# Patient Record
Sex: Female | Born: 1995
Health system: Southern US, Community
[De-identification: ages and names within clinical notes are randomized; demographics above are authoritative.]

## PROBLEM LIST (undated history)

## (undated) DIAGNOSIS — Z1501 Genetic susceptibility to malignant neoplasm of breast: Secondary | ICD-10-CM

## (undated) DIAGNOSIS — Z8041 Family history of malignant neoplasm of ovary: Secondary | ICD-10-CM

## (undated) DIAGNOSIS — Z8481 Family history of carrier of genetic disease: Secondary | ICD-10-CM

## (undated) DIAGNOSIS — Z1509 Genetic susceptibility to other malignant neoplasm: Secondary | ICD-10-CM

## (undated) DIAGNOSIS — G43909 Migraine, unspecified, not intractable, without status migrainosus: Secondary | ICD-10-CM

## (undated) DIAGNOSIS — Z803 Family history of malignant neoplasm of breast: Secondary | ICD-10-CM

## (undated) DIAGNOSIS — J45909 Unspecified asthma, uncomplicated: Secondary | ICD-10-CM

## (undated) HISTORY — DX: Family history of carrier of genetic disease: Z84.81

## (undated) HISTORY — DX: Migraine, unspecified, not intractable, without status migrainosus: G43.909

## (undated) HISTORY — DX: Genetic susceptibility to other malignant neoplasm: Z15.09

## (undated) HISTORY — DX: Family history of malignant neoplasm of breast: Z80.3

## (undated) HISTORY — DX: Family history of malignant neoplasm of ovary: Z80.41

## (undated) HISTORY — DX: Genetic susceptibility to malignant neoplasm of breast: Z15.01

## (undated) HISTORY — DX: Unspecified asthma, uncomplicated: J45.909

## (undated) HISTORY — PX: TONSILLECTOMY: SHX5217

## (undated) HISTORY — PX: ADENOIDECTOMY: SHX5191

---
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
## 2016-01-13 DIAGNOSIS — R87612 Low grade squamous intraepithelial lesion on cytologic smear of cervix (LGSIL): Secondary | ICD-10-CM | POA: Insufficient documentation

## 2016-01-13 DIAGNOSIS — Z8481 Family history of carrier of genetic disease: Secondary | ICD-10-CM | POA: Insufficient documentation

## 2016-01-13 DIAGNOSIS — G43009 Migraine without aura, not intractable, without status migrainosus: Secondary | ICD-10-CM | POA: Insufficient documentation

## 2017-07-20 DIAGNOSIS — Z1509 Genetic susceptibility to other malignant neoplasm: Secondary | ICD-10-CM

## 2017-07-20 DIAGNOSIS — Z1501 Genetic susceptibility to malignant neoplasm of breast: Secondary | ICD-10-CM

## 2017-07-20 HISTORY — DX: Genetic susceptibility to other malignant neoplasm: Z15.09

## 2017-07-20 HISTORY — DX: Genetic susceptibility to malignant neoplasm of breast: Z15.01

## 2018-04-01 ENCOUNTER — Other Ambulatory Visit: Payer: Self-pay

## 2018-04-01 ENCOUNTER — Other Ambulatory Visit (HOSPITAL_COMMUNITY)
Admission: RE | Admit: 2018-04-01 | Discharge: 2018-04-01 | Disposition: A | Discharge status: Home / Self Care | Payer: 59 | Source: Ambulatory Visit | Attending: Obstetrics and Gynecology | Admitting: Obstetrics and Gynecology

## 2018-04-01 ENCOUNTER — Encounter: Payer: Self-pay | Admitting: Obstetrics and Gynecology

## 2018-04-01 ENCOUNTER — Ambulatory Visit: Payer: 59 | Admitting: Obstetrics and Gynecology

## 2018-04-01 VITALS — BP 100/62 | Ht 61.0 in | Wt 128.0 lb

## 2018-04-01 DIAGNOSIS — Z8481 Family history of carrier of genetic disease: Secondary | ICD-10-CM | POA: Diagnosis not present

## 2018-04-01 DIAGNOSIS — Z01419 Encounter for gynecological examination (general) (routine) without abnormal findings: Secondary | ICD-10-CM | POA: Diagnosis not present

## 2018-04-01 DIAGNOSIS — Z789 Other specified health status: Secondary | ICD-10-CM

## 2018-04-01 DIAGNOSIS — IMO0001 Reserved for inherently not codable concepts without codable children: Secondary | ICD-10-CM

## 2018-04-01 DIAGNOSIS — Z3042 Encounter for surveillance of injectable contraceptive: Secondary | ICD-10-CM

## 2018-04-01 MED ORDER — MEDROXYPROGESTERONE ACETATE 150 MG/ML IM SUSP
150.0000 mg | Freq: Once | INTRAMUSCULAR | Status: AC
  Administered 2018-04-01: 150 mg via INTRAMUSCULAR

## 2018-04-01 NOTE — Progress Notes (Signed)
22 y.o. G0P0000 Single Caucasian female here for annual exam.    Hx migraines without aura. Feels groogy the day before her HA. Has seen neurology.   On Depo provera.  Does not have menses.  Last Depo was 01/11/18.  Never used COCs.  Not sexually active for the past year.  Has had prior STD screening.   Mother dx with ovarian cancer at age 68.  Mother tested positive for BRCA1.  Maternal grandmother died at 107 from breast cancer.   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   PCP:   none  Patient's last menstrual period was 05/21/2015 (approximate).     Period Cycle (Days): (Depo injection, no cycle)     Sexually active: Yes.    The current method of family planning is Depo-Provera injections.    Exercising: No.  The patient does not participate in regular exercise at present. Smoker:  no  Health Maintenance: Pap:  07/2017 - abnormal per patient.  Thinks it was LGSIL. History of abnormal Pap:  All have been abnormal, colposcopy no bx.   MMG:  n/a Colonoscopy:  n/a BMD:   n/a  Result  n/a TD 2017 Gardasil:   Yes, 06/23/2006, 02/09/2006, 11/23/2005 JOA:CZYSA  Hep C: never Screening Labs:  Hb today: n/a, Urine today: n/a  Pt arrived for Depo Provera injection.  Pt tolerated injection well in right gluteal.  Pt should return between [facility] and Dec 14    reports that she has never smoked. She has never used smokeless tobacco. She reports that she has current or past drug history. She reports that she does not drink alcohol.  Past Medical History:  Diagnosis Date  . Migraine    no apparent aura    Past Surgical History:  Procedure Laterality Date  . KNEE SURGERY Right 2012    Current Outpatient Medications  Medication Sig Dispense Refill  . Aspirin-Acetaminophen-Caffeine (MIGRAINE RELIEF PO) Take by mouth as needed.    . medroxyPROGESTERone (DEPO-PROVERA) 150 MG/ML injection as directed.  3   No current facility-administered medications for this  visit.     Family History  Problem Relation Age of Onset  . Ovarian cancer Mother        BRCA positive  . Breast cancer Maternal Grandmother     Review of Systems  Constitutional: Negative.   HENT: Negative.   Eyes: Negative.   Respiratory: Negative.   Cardiovascular: Negative.   Gastrointestinal: Negative.   Endocrine: Negative.   Genitourinary: Negative.   Musculoskeletal: Negative.   Skin: Negative.   Allergic/Immunologic: Negative.   Neurological: Negative.   Hematological: Negative.   Psychiatric/Behavioral: Negative.   All other systems reviewed and are negative.   Exam:   BP 100/62   Ht _0  (1.549 m)   Wt 128 lb (58.1 kg)   LMP 05/21/2015 (Approximate)   BMI 24.19 kg/m     General appearance: alert, cooperative and appears stated age Head: Normocephalic, without obvious abnormality, atraumatic Neck: no adenopathy, supple, symmetrical, trachea midline and thyroid normal to inspection and palpation Lungs: clear to auscultation bilaterally Breasts: normal appearance, no masses or tenderness, No nipple retraction or dimpling, No nipple discharge or bleeding, No axillary or supraclavicular adenopathy Heart: regular rate and rhythm Abdomen: soft, non-tender; no masses, no organomegaly Extremities: extremities normal, atraumatic, no cyanosis or edema Skin: Skin color, texture, turgor normal. No rashes or lesions Lymph nodes: Cervical, supraclavicular, and axillary nodes normal. No abnormal inguinal nodes palpated Neurologic: Grossly  normal  Pelvic: External genitalia:  no lesions              Urethra:  normal appearing urethra with no masses, tenderness or lesions              Bartholins and Skenes: normal                 Vagina: normal appearing vagina with normal color and discharge, no lesions              Cervix: no lesions              Pap taken: Yes.   Bimanual Exam:  Uterus:  normal size, contour, position, consistency, mobility, non-tender               Adnexa: no mass, fullness, tenderness             Chaperone was present for exam.  Assessment:   Well woman visit with normal exam. Migraines.  No aura.  FH ovarian cancer.  Mother is BRCA1 positive.  Depo Provera patient.  Hx abnormal paps.   Plan: Mammogram screening to be defined based on genetic testing.  Recommended self breast awareness. Pap and HR HPV as above. Guidelines for Calcium, Vitamin D, regular exercise program including cardiovascular and weight bearing exercise. Routine labs. Referral for genetic counseling and testing.  Continue depo Provera 150 mg IM q 3 months for one year.  OK to give today.  Get pap and colpo records. Follow up annually and prn.   After visit summary provided.

## 2018-04-01 NOTE — Patient Instructions (Signed)

## 2018-04-02 LAB — COMPREHENSIVE METABOLIC PANEL
A/G RATIO: 2.4 — AB (ref 1.2–2.2)
ALK PHOS: 41 IU/L (ref 39–117)
ALT: 12 IU/L (ref 0–32)
AST: 19 IU/L (ref 0–40)
Albumin: 5 g/dL (ref 3.5–5.5)
BUN / CREAT RATIO: 15 (ref 9–23)
BUN: 12 mg/dL (ref 6–20)
Bilirubin Total: 0.4 mg/dL (ref 0.0–1.2)
CHLORIDE: 102 mmol/L (ref 96–106)
CO2: 26 mmol/L (ref 20–29)
Calcium: 10.1 mg/dL (ref 8.7–10.2)
Creatinine, Ser: 0.81 mg/dL (ref 0.57–1.00)
GFR calc Af Amer: 118 mL/min/{1.73_m2} (ref >59)
GFR calc non Af Amer: 103 mL/min/{1.73_m2} (ref >59)
GLOBULIN, TOTAL: 2.1 g/dL (ref 1.5–4.5)
Glucose: 77 mg/dL (ref 65–99)
POTASSIUM: 4.6 mmol/L (ref 3.5–5.2)
Sodium: 140 mmol/L (ref 134–144)
Total Protein: 7.1 g/dL (ref 6.0–8.5)

## 2018-04-02 LAB — CBC
HEMATOCRIT: 38.7 % (ref 34.0–46.6)
Hemoglobin: 13.1 g/dL (ref 11.1–15.9)
MCH: 30.2 pg (ref 26.6–33.0)
MCHC: 33.9 g/dL (ref 31.5–35.7)
MCV: 89 fL (ref 79–97)
Platelets: 299 10*3/uL (ref 150–450)
RBC: 4.34 x10E6/uL (ref 3.77–5.28)
RDW: 12.4 % (ref 12.3–15.4)
WBC: 7.3 10*3/uL (ref 3.4–10.8)

## 2018-04-02 LAB — LIPID PANEL
CHOLESTEROL TOTAL: 173 mg/dL (ref 100–199)
Chol/HDL Ratio: 2.5 ratio (ref 0.0–4.4)
HDL: 68 mg/dL (ref >39)
LDL CALC: 93 mg/dL (ref 0–99)
Triglycerides: 62 mg/dL (ref 0–149)
VLDL Cholesterol Cal: 12 mg/dL (ref 5–40)

## 2018-04-05 LAB — CYTOLOGY - PAP
DIAGNOSIS: NEGATIVE
HPV: DETECTED — AB

## 2018-04-08 ENCOUNTER — Encounter: Payer: Self-pay | Admitting: Genetic Counselor

## 2018-04-08 ENCOUNTER — Telehealth: Payer: Self-pay | Admitting: Genetic Counselor

## 2018-04-08 NOTE — Telephone Encounter (Signed)
New genetic counseling referral received from Dr. Quincy Simmonds. Pt has been scheduled to see Roma Kayser on 10/14 at 2pm. Letter mailed to the pt.

## 2018-04-12 ENCOUNTER — Other Ambulatory Visit: Payer: Self-pay | Admitting: *Deleted

## 2018-04-12 ENCOUNTER — Telehealth: Payer: Self-pay | Admitting: Genetic Counselor

## 2018-04-12 DIAGNOSIS — R87810 Cervical high risk human papillomavirus (HPV) DNA test positive: Secondary | ICD-10-CM

## 2018-04-12 NOTE — Telephone Encounter (Signed)
Pt cld to reschedule appt with Roma Kayser to 10/23 at 10am.

## 2018-05-02 ENCOUNTER — Encounter: Payer: 59 | Admitting: Genetic Counselor

## 2018-05-02 ENCOUNTER — Other Ambulatory Visit: Payer: 59

## 2018-05-04 ENCOUNTER — Ambulatory Visit (INDEPENDENT_AMBULATORY_CARE_PROVIDER_SITE_OTHER): Payer: 59 | Admitting: Obstetrics and Gynecology

## 2018-05-04 ENCOUNTER — Encounter: Payer: Self-pay | Admitting: Obstetrics and Gynecology

## 2018-05-04 ENCOUNTER — Other Ambulatory Visit: Payer: Self-pay

## 2018-05-04 ENCOUNTER — Ambulatory Visit: Payer: Self-pay

## 2018-05-04 VITALS — BP 110/60 | HR 84 | Ht 61.0 in | Wt 130.0 lb

## 2018-05-04 DIAGNOSIS — N72 Inflammatory disease of cervix uteri: Secondary | ICD-10-CM | POA: Diagnosis not present

## 2018-05-04 DIAGNOSIS — Z01812 Encounter for preprocedural laboratory examination: Secondary | ICD-10-CM

## 2018-05-04 DIAGNOSIS — R87810 Cervical high risk human papillomavirus (HPV) DNA test positive: Secondary | ICD-10-CM

## 2018-05-04 LAB — POCT URINE PREGNANCY: PREG TEST UR: NEGATIVE

## 2018-05-04 NOTE — Progress Notes (Signed)
  Subjective:     Patient ID: Rachel Stein, female   DOB: 1996-05-21, 22 y.o.   MRN: [id number]  HPI Patient here today for colposcopy for abnormal pap smear 04-01-18 Neg:Pos HR HPV. Personal hx of positive HR HPV in January 2019.   Review of Systems  All other systems reviewed and are negative.  LMP: 05-21-15 Contraception: Depo Provera UPT: Neg    Objective:   Physical Exam  Genitourinary:    Colposcopy - cervix, vagina, and vulva.  Consent for procedure.  3% acetic acid used in vagina and on vulva. White light and green light filter used.  Colposcopy satisfactory:  Yes   ___x__          No    _____ Findings:    Cervix:  punctations at 12:00, thickened acetowhite change at 6:00. Vagina: no lesions noted. Biopsies:   ECC, biopsy at 6:00, and biopsy at 12:00. Silver nitrate and Monsel's placed to biopsy sites.  EBL 5 cc.  No complications.       Assessment:     Positive HR HPV.     Plan:     We discussed HPV, paps, colposcopy, and LEEP briefly.  FU biopsies.  Precautions given.   After visit summary to patient.

## 2018-05-04 NOTE — Patient Instructions (Signed)
Colposcopy, Care After  This sheet gives you information about how to care for yourself after your procedure. Your doctor may also give you more specific instructions. If you have problems or questions, contact your doctor.  What can I expect after the procedure?  If you did not have a tissue sample removed (did not have a biopsy), you may only have some spotting for a few days. You can go back to your normal activities.  If you had a tissue sample removed, it is common to have:  · Soreness and pain. This may last for a few days.  · Light-headedness.  · Mild bleeding from your vagina or dark-colored, grainy discharge from your vagina. This may last for a few days. You may need to wear a sanitary pad.  · Spotting for at least 48 hours after the procedure.    Follow these instructions at home:  · Take over-the-counter and prescription medicines only as told by your doctor. Ask your doctor what medicines you can start taking again. This is very important if you take blood-thinning medicine.  · Do not drive or use heavy machinery while taking prescription pain medicine.  · For 3 days, or as long as your doctor tells you, avoid:  ? Douching.  ? Using tampons.  ? Having sex.  · If you use birth control (contraception), keep using it.  · Limit activity for the first day after the procedure. Ask your doctor what activities are safe for you.  · It is up to you to get the results of your procedure. Ask your doctor when your results will be ready.  · Keep all follow-up visits as told by your doctor. This is important.  Contact a doctor if:  · You get a skin rash.  Get help right away if:  · You are bleeding a lot from your vagina. It is a lot of bleeding if you are using more than one pad an hour for 2 hours in a row.  · You have clumps of blood (blood clots) coming from your vagina.  · You have a fever.  · You have chills  · You have pain in your lower belly (pelvic area).  · You have signs of infection, such as vaginal  discharge that is:  ? Different than usual.  ? Yellow.  ? Bad-smelling.  · You have very pain or cramps in your lower belly that do not get better with medicine.  · You feel light-headed.  · You feel dizzy.  · You pass out (faint).  Summary  · If you did not have a tissue sample removed (did not have a biopsy), you may only have some spotting for a few days. You can go back to your normal activities.  · If you had a tissue sample removed, it is common to have mild pain and spotting for 48 hours.  · For 3 days, or as long as your doctor tells you, avoid douching, using tampons and having sex.  · Get help right away if you have bleeding, very bad pain, or signs of infection.  This information is not intended to replace advice given to you by your health care provider. Make sure you discuss any questions you have with your health care provider.  Document Released: 12/23/2007 Document Revised: 03/25/2016 Document Reviewed: 03/25/2016  Elsevier Interactive Patient Education © 2018 Elsevier Inc.

## 2018-05-06 ENCOUNTER — Telehealth: Payer: Self-pay | Admitting: Obstetrics and Gynecology

## 2018-05-06 NOTE — Telephone Encounter (Signed)
Called patient again. Voicemail confirms first and last name. Did not leave another message. Trying to reach patient before office closes.

## 2018-05-06 NOTE — Telephone Encounter (Signed)
Patient called and said she is concerned because she is bleeding more today since having her colposcopy on 05/04/18 than she was.

## 2018-05-06 NOTE — Telephone Encounter (Signed)
Called pt, no answer. Left message to return my call.   Colposcopy on 05/04/18.  ECC and cervical  biopsy x 2. Silver nitrate and monsels used.  Depo provera for birth control.

## 2018-05-07 ENCOUNTER — Encounter: Payer: Self-pay | Admitting: Obstetrics and Gynecology

## 2018-05-09 NOTE — Telephone Encounter (Signed)
-----   Message from Nunzio Cobbs, MD sent at 05/07/2018  7:49 AM EDT ----- Please report results of colposcopy to patient.  Her biopsies are all benign.  She will need to return to have her next pap in one year.  Pap recall - 08.  Positive HR HPV.  Prior outside lab - positive HR HPV also.

## 2018-05-09 NOTE — Telephone Encounter (Signed)
Patient returned call. She states bleeding is much better.  Using 1 pad per day. Spotting only, red blood. No pain, no fevers, no vaginal discharge. Advised patient to call back if any concerning symptoms, discussed bleeding, pain and discharge s/p colposcopy.   Results given to patient.  She verbalized understanding of results.  Stressed importance of repeat pap smear in one year.  Patient agreeable to plan.  Will follow up prn.  Encounter to Dr. Quincy Simmonds and will close.  08 recall entered.

## 2018-05-11 ENCOUNTER — Inpatient Hospital Stay: Payer: 59 | Attending: Genetic Counselor | Admitting: Genetic Counselor

## 2018-05-11 ENCOUNTER — Inpatient Hospital Stay: Payer: 59

## 2018-05-11 ENCOUNTER — Encounter: Payer: Self-pay | Admitting: Genetic Counselor

## 2018-05-11 DIAGNOSIS — Z8041 Family history of malignant neoplasm of ovary: Secondary | ICD-10-CM

## 2018-05-11 DIAGNOSIS — Z803 Family history of malignant neoplasm of breast: Secondary | ICD-10-CM

## 2018-05-11 DIAGNOSIS — Z7183 Encounter for nonprocreative genetic counseling: Secondary | ICD-10-CM

## 2018-05-11 DIAGNOSIS — Z8481 Family history of carrier of genetic disease: Secondary | ICD-10-CM

## 2018-05-11 NOTE — Progress Notes (Signed)
REFERRING PROVIDER: Nunzio Cobbs, MD [address] [place], [place] [address]  PRIMARY PROVIDER:  Patient, No Pcp Per  PRIMARY REASON FOR VISIT:  1. Family history of BRCA1 gene positive   2. Family history of ovarian cancer   3. Family history of breast cancer      HISTORY OF PRESENT ILLNESS:   Rachel Stein, a 22 y.o. female, was seen for a [facility] cancer genetics consultation at the request of Dr. Yisroel Ramming due to a family history of cancer.  Rachel Stein presents to clinic today to discuss the possibility of a hereditary predisposition to cancer, genetic testing, and to further clarify her future cancer risks, as well as potential cancer risks for family members.   Rachel Stein is a 22 y.o. female with no personal history of cancer.  Her mother and two sisters have tested positive for a BRCA1 del exon 1-2.  CANCER HISTORY:   No history exists.     HORMONAL RISK FACTORS:  Menarche was at age 24.  First live birth at age N/A.  OCP use for approximately depoprovera for 3 years years.  Ovaries intact: yes.  Hysterectomy: no.  Menopausal status: premenopausal.  HRT use: 0 years. Colonoscopy: no; not examined. Mammogram within the last year: no. Number of breast biopsies: 0. Up to date with pelvic exams:  yes. Any excessive radiation exposure in the past:  no  Past Medical History:  Diagnosis Date  . Family history of BRCA gene mutation    patient's mother - BRCA1 positive  . Family history of breast cancer   . Family history of ovarian cancer   . Migraine    no apparent aura    Past Surgical History:  Procedure Laterality Date  . KNEE SURGERY Right 2012    Social History   Socioeconomic History  . Marital status: Single    Spouse name: Not on file  . Number of children: Not on file  . Years of education: Not on file  . Highest education level: Not on file  Occupational History  . Not on file  Social Needs  . Financial  resource strain: Not on file  . Food insecurity:    Worry: Not on file    Inability: Not on file  . Transportation needs:    Medical: Not on file    Non-medical: Not on file  Tobacco Use  . Smoking status: Never Smoker  . Smokeless tobacco: Never Used  Substance and Sexual Activity  . Alcohol use: Never    Frequency: Never  . Drug use: Not Currently  . Sexual activity: Yes    Birth control/protection: Injection  Lifestyle  . Physical activity:    Days per week: Not on file    Minutes per session: Not on file  . Stress: Not on file  Relationships  . Social connections:    Talks on phone: Not on file    Gets together: Not on file    Attends religious service: Not on file    Active member of club or organization: Not on file    Attends meetings of clubs or organizations: Not on file    Relationship status: Not on file  Other Topics Concern  . Not on file  Social History Narrative  . Not on file     FAMILY HISTORY:  We obtained a detailed, 4-generation family history.  Significant diagnoses are listed below: Family History  Problem Relation Age of Onset  .  Ovarian cancer Mother 48       BRCA positive  . BRCA 1/2 Mother        BRCA1  . Breast cancer Maternal Grandmother        d. 3  . BRCA 1/2 Sister        BRCA1 pos  . BRCA 1/2 Sister        BRCA1 pos  . Uterine cancer Other        MGMs sister    The patient has two sisters who are cancer free, but are BRCA1 pos.  Both parents are living.  The patient's mother developed ovarian cancer at 1 and is BRCA1 pos.  She has a maternal half brother who not much is known about.  Both maternal grandparents are deceased.  The grandmother developed breast cancer at 88 and died at 61. She has a sister who had uterine cancer.  The patient's father is alive and cancer free.  He had two brothers and a sister and a maternal 1/2 sister.  All are cancer free.  There is no reported cancer history on this side of the family.  Ms.  Stein is aware of previous family history of genetic testing for hereditary cancer risks. Patient's maternal ancestors are of Caucasian descent, and paternal ancestors are of [place] descent. There is no reported Ashkenazi Jewish ancestry. There is no known consanguinity.  GENETIC COUNSELING ASSESSMENT: Rachel Stein is a 22 y.o. female with a family history of breast and ovarian cancer and a known BRCA1 mutation which is somewhat suggestive of a hereditary cancer syndrome and predisposition to cancer. We, therefore, discussed and recommended the following at today's visit.   DISCUSSION: We discussed that about 5-10% of breast cancer and up to 20% of ovarian cancer are due to hereditary causes, most commonly BRCA mutations.  The patient has a 50% chance of testing positive for the known mutation in her mother.  We discussed that about 6% of individuals who have one mutation, have a second hereditary mutation as well.    We reviewed the characteristics, features and inheritance patterns of hereditary cancer syndromes. We also discussed genetic testing, including the appropriate family members to test, the process of testing, insurance coverage and turn-around-time for results. We discussed the implications of a negative, positive and/or variant of uncertain significant result. In order to get genetic test results in a timely manner so that Rachel Stein can use these genetic test results for surgical decisions, we recommended Rachel Stein pursue genetic testing for the 9-gene STAT panel. If this test is negative, we then recommend Rachel Stein pursue reflex genetic testing to the Multi cancer gene panel. The Hereditary Gene Panel offered by [facility] includes sequencing and/or deletion duplication testing of the following 47 genes: APC, ATM, AXIN2, BARD1, BMPR1A, BRCA1, BRCA2, BRIP1, CDH1, CDK4, CDKN2A (p14ARF), CDKN2A (p16INK4a), CHEK2, CTNNA1, DICER1, EPCAM (Deletion/duplication testing only), GREM1  (promoter region deletion/duplication testing only), KIT, MEN1, MLH1, MSH2, MSH3, MSH6, MUTYH, NBN, NF1, NHTL1, PALB2, PDGFRA, PMS2, POLD1, POLE, PTEN, RAD50, RAD51C, RAD51D, SDHB, SDHC, SDHD, SMAD4, SMARCA4. STK11, TP53, TSC1, TSC2, and VHL.  The following genes were evaluated for sequence changes only: SDHA and HOXB13 c.251G>A variant only.   Based on Rachel Stein's family history of cancer, she meets medical criteria for genetic testing. Despite that she meets criteria, she may still have an out of pocket cost. We discussed that if her out of pocket cost for testing is over $100, the laboratory will call and confirm whether  she wants to proceed with testing.  If the out of pocket cost of testing is less than $100 she will be billed by the genetic testing laboratory.   PLAN: After considering the risks, benefits, and limitations, Rachel Stein  provided informed consent to pursue genetic testing and the blood sample was sent to [facility] for analysis of the STAT panel, reflexing to the Multi cancer gene panel. Results should be available within approximately 5-12 days, and the reflex can take an additional 1-2 weeks' time, at which point they will be disclosed by telephone to Rachel Stein, as will any additional recommendations warranted by these results. Rachel Stein will receive a summary of her genetic counseling visit and a copy of her results once available. This information will also be available in Epic. We encouraged Rachel Stein to remain in contact with cancer genetics annually so that we can continuously update the family history and inform her of any changes in cancer genetics and testing that may be of benefit for her family. Rachel Stein questions were answered to her satisfaction today. Our contact information was provided should additional questions or concerns arise.  Lastly, we encouraged Rachel Stein to remain in contact with cancer genetics annually so that we can continuously  update the family history and inform her of any changes in cancer genetics and testing that may be of benefit for this family.   Ms.  Stein questions were answered to her satisfaction today. Our contact information was provided should additional questions or concerns arise. Thank you for the referral and allowing [place] to share in the care of your patient.   Rachel Sliva P. [place]ne [place], [place], [facility] Certified Genetic Counselor [email or web address] phone: [phone]  The patient was seen for a total of 35 minutes in face-to-face genetic counseling.  This patient was discussed with Drs. Magrinat, Lindi Adie and/or Burr Medico who agrees with the above.    _______________________________________________________________________ For Office Staff:  Number of people involved in session: 1 Was an Intern/ student involved with case: no

## 2018-05-20 ENCOUNTER — Telehealth: Payer: Self-pay | Admitting: Genetic Counselor

## 2018-05-20 ENCOUNTER — Encounter: Payer: Self-pay | Admitting: Genetic Counselor

## 2018-05-20 DIAGNOSIS — Z1509 Genetic susceptibility to other malignant neoplasm: Secondary | ICD-10-CM

## 2018-05-20 DIAGNOSIS — Z1379 Encounter for other screening for genetic and chromosomal anomalies: Secondary | ICD-10-CM | POA: Insufficient documentation

## 2018-05-20 DIAGNOSIS — Z1501 Genetic susceptibility to malignant neoplasm of breast: Secondary | ICD-10-CM | POA: Insufficient documentation

## 2018-05-20 NOTE — Telephone Encounter (Signed)
Revealed that patinet tested positive for the [facility] in BRCA1.  She will come to clinic on 11/14 at 11 AM.

## 2018-05-31 ENCOUNTER — Inpatient Hospital Stay: Payer: 59 | Attending: Genetic Counselor | Admitting: Genetic Counselor

## 2018-05-31 DIAGNOSIS — Z1501 Genetic susceptibility to malignant neoplasm of breast: Secondary | ICD-10-CM | POA: Diagnosis not present

## 2018-05-31 DIAGNOSIS — Z1379 Encounter for other screening for genetic and chromosomal anomalies: Secondary | ICD-10-CM

## 2018-05-31 DIAGNOSIS — Z1509 Genetic susceptibility to other malignant neoplasm: Secondary | ICD-10-CM | POA: Diagnosis not present

## 2018-05-31 NOTE — Progress Notes (Signed)
GENETIC TEST RESULTS   Patient Name: Rachel Stein Patient Age: 22 y.o. Encounter Date: 05/31/2018  Referring Provider: Josefa Half, MD    Rachel Stein was seen in the [place] clinic on May 31, 2018 due to a family history of cancer and an identified BRCA1 pathogenic mutation. Please refer to the prior Genetics clinic note for more information regarding Rachel Stein's medical and family histories and our assessment at the time.   FAMILY HISTORY:  We obtained a detailed, 4-generation family history.  Significant diagnoses are listed below: Family History  Problem Relation Age of Onset  . Ovarian cancer Mother 79       BRCA positive  . BRCA 1/2 Mother        BRCA1  . Breast cancer Maternal Grandmother        d. 50  . BRCA 1/2 Sister        BRCA1 pos  . BRCA 1/2 Sister        BRCA1 pos  . Uterine cancer Other        MGMs sister    The patient has two sisters who are cancer free, but are BRCA1 pos.  Both parents are living.  The patient's mother developed ovarian cancer at 88 and is BRCA1 pos.  She has a maternal half brother who not much is known about.  Both maternal grandparents are deceased.  The grandmother developed breast cancer at 31 and died at 77. She has a sister who had uterine cancer.  The patient's father is alive and cancer free.  He had two brothers and a sister and a maternal 1/2 sister.  All are cancer free.  There is no reported cancer history on this side of the family.  Rachel Stein is aware of previous family history of genetic testing for hereditary cancer risks. Patient's maternal ancestors are of Caucasian descent, and paternal ancestors are of [place] descent. There is no reported Ashkenazi Jewish ancestry. There is no known consanguinity.  GENETIC TESTING:  At the time of Rachel Stein's visit, we recommended she pursue genetic testing of the common hereditary cancer panel. The genetic testing reported on May 20, 2018 through the  Common Hereditary Cancer Panel offered by [facility] identified a single, heterozygous pathogenic gene mutation called BRCA1, Deletion (Exons 1-2). There were no deleterious mutations in APC, ATM, AXIN2, BARD1, BMPR1A, BRCA2, BRIP1, CDH1, CDK4, CDKN2A, CHEK2, CTNNA1, DICER1, EPCAM, GREM1, HOXB13, KIT, MEN1, MLH1, MSH2, MSH3, MSH6, MUTYH, NBN, NF1, NTHL1, PALB2, PDGFRA, PMS2, POLD1, POLE, PTEN, RAD50, RAD51C, RAD51D, SDHA, SDHB, SDHC, SDHD, SMAD4, SMARCA4. STK11, TP53, TSC1, TSC2, and VHL .  MEDICAL MANAGEMENT: Women who have a BRCA mutation have an increased risk for both breast and ovarian cancer.   As discussed with Rachel Stein, to reduce the risk for breast cancer, prophylactic bilateral mastectomy is the most effective option for risk reduction. However, for women who choose to keep their breasts intensified screening is equally safe.  We recommend yearly mammograms, yearly breast MRI, twice-yearly clinical breast exams, and monthly self-breast exams. Rachel Stein will be referred to the [facility] high risk clinic to discuss surgical options.  To reduce the risk for ovarian cancer, we recommend Rachel Stein have a prophylactic bilateral salpingo-oophorectomy when childbearing is completed, if planned. We discussed that screening with CA-125 blood tests and transvaginal ultrasounds can be done twice per year. However, these tests have not been shown to detect ovarian cancer at an early stage.  Rachel Stein will follow up with Dr. Quincy Stein  in regards to her ovarian cancer risk.    RISK REDUCTION: There are several things that can be offered to individuals who are carriers for BRCA mutations that will reduce the risk for getting cancer.   The providers at the high risk clinic can discuss this further, however, the use of oral contraceptives can lower the risk for ovarian cancer, and, per case control studies, does not significantly increase the risk for breast cancer in BRCA patients. Case control studies have  shown that oral contraceptives can lower the risk for ovarian cancer in women with BRCA mutations. Additionally, a more recent meta-analysis, including one cohort (n=3,181) and one case control study (1,096 cases and 2,878 controls) also showed an inverse correlation between ovarian cancer and ever having used oral contraceptives (OR, 0.58; 95% CI = 0.46-0.73). Studies on oral contraceptives and breast cancer have been conflicting, with some studies suggesting that there is not an increased risk for breast cancer in BRCA mutation carriers, while others suggest that there could be a risk. That said, two meta-analysis studies have shown that there is not an increased risk for breast cancer with oral contraceptive use in BRCA1 and BRCA2 carriers.   In individuals who have a prophylactic bilateral salpingo-oophorectomy (BSO), the risk for breast cancer is reduced by up to 50%. It has been reported that short term hormone replacement therapy in women undergoing prophylactic BSO does not negate the reduction of breast cancer risk associated with surgery (1.2018 NCCN guidelines).  FAMILY MEMBERS: It is important that all of Rachel Stein's relatives (both men and women) know of the presence of this gene mutation. Site-specific genetic testing can sort out who in the family is at risk and who is not.   Rachel Stein sisters have been tested, and both are also BRCA1 positive.  She reports talking with her sisters daily, and that they rely on each other for support. Based on Rachel Stein's genetic testing, her extended relatives would qualify for free genetic testing through [facility] for up to 90 days after Rachel Stein's report date.  There are difficult relationships within Rachel Stein's maternal family that make the sharing of the information difficult.  We discussed that her cousins could take advantage of testing, even if her uncle was not interested.  SUPPORT AND RESOURCES: If Rachel Stein is interested in  BRCA-specific information and support, Facing Our Risk (www.facingourrisk.com) is a group that some people have found useful. They provide opportunities to speak with other individuals from high-risk families. To locate genetic counselors in other cities, visit the website of the [employer] (Artist[employer].se) and Secretary/administrator for a Social worker by zip code.  We encouraged Rachel Stein to remain in contact with [place] on an annual basis so we can update her personal and family histories, and let her know of advances in cancer genetics that may benefit the family. Our contact number was provided. Ms. Hanger questions were answered to her satisfaction today, and she knows she is welcome to call anytime with additional questions.    P. Florene Glen, [place], [facility] Certified Genetic Counselor [email or web address] phone: [phone]

## 2018-06-01 ENCOUNTER — Encounter: Payer: Self-pay | Admitting: Obstetrics and Gynecology

## 2018-06-02 ENCOUNTER — Encounter: Payer: 59 | Admitting: Genetic Counselor

## 2018-06-09 ENCOUNTER — Telehealth: Payer: Self-pay | Admitting: Obstetrics and Gynecology

## 2018-06-09 ENCOUNTER — Encounter: Payer: Self-pay | Admitting: Oncology

## 2018-06-09 ENCOUNTER — Telehealth: Payer: Self-pay | Admitting: Oncology

## 2018-06-09 NOTE — Telephone Encounter (Signed)
Spoke with patient, advised as seen below per Dr. Quincy Simmonds.   Patient is scheduled for Nurse visit for depo provera on 12/2. Cancelled nurse visit, scheduled as OV with Dr. Quincy Simmonds on 12/2 at [place].   Patient is scheduled with Dr. Jana Hakim on 07/05/18.   Routing to provider for final review. Patient is agreeable to disposition. Will close encounter.

## 2018-06-09 NOTE — Telephone Encounter (Signed)
Pt has been cld and scheduled to see Dr. Jana Hakim for the high risk breast clinic on 12/17 at 4pm. Pt aware to arrive 15 minutes early. Letter mailed.

## 2018-06-09 NOTE — Telephone Encounter (Signed)
Please contact patient in follow up to her recent diagnosis of positive BRCA1 status.  She is aware of this result.   She need to have a follow up appointment with me here.   She is also in the process of having a referral to the high risk breast clinic at the [facility].    

## 2018-06-20 ENCOUNTER — Encounter: Payer: Self-pay | Admitting: Obstetrics and Gynecology

## 2018-06-20 ENCOUNTER — Ambulatory Visit (INDEPENDENT_AMBULATORY_CARE_PROVIDER_SITE_OTHER): Payer: 59 | Admitting: Obstetrics and Gynecology

## 2018-06-20 ENCOUNTER — Ambulatory Visit: Payer: 59

## 2018-06-20 ENCOUNTER — Other Ambulatory Visit: Payer: Self-pay

## 2018-06-20 VITALS — BP 110/58 | HR 76 | Resp 14 | Ht 61.0 in | Wt 128.0 lb

## 2018-06-20 DIAGNOSIS — Z1509 Genetic susceptibility to other malignant neoplasm: Secondary | ICD-10-CM

## 2018-06-20 DIAGNOSIS — Z1501 Genetic susceptibility to malignant neoplasm of breast: Secondary | ICD-10-CM

## 2018-06-20 MED ORDER — LEVONORGEST-ETH ESTRAD 91-DAY 0.15-0.03 MG PO TABS: 1.0000 | ORAL_TABLET | Freq: Every day | ORAL | 1 refills | Status: DC

## 2018-06-20 MED FILL — SETLAKIN 0.15 MG-0.03 MG TA: 0.15-0.03 | 91 days supply | Qty: 91 | Fill #0

## 2018-06-20 NOTE — Progress Notes (Signed)
GYNECOLOGY  VISIT   HPI: 22 y.o.   Single  Caucasian  female   [id number] with No LMP recorded. Patient has had an injection.   here for   Genetic testing follow up   Positive BRCA1.   Patient is on Depo Provera because her sister is on it.  On Depo Provera for 3 years.  Due today.   Denies hx of migraine with aura, HTN, liver disease, DVT/PE or FH of this.   Mother dx with ovarian cancer age 74 when she underwent hysterectomy, 2016. Mastectomy in 2013 as preventative.  She tested positive for BRCA1 in 2012.   GYNECOLOGIC HISTORY: No LMP recorded. Patient has had an injection. Contraception:  Depo- provera Menopausal hormone therapy:  none Last mammogram:  n/a Last pap smear:   04-01-18 negative, HR HPV positive. 05-04-18- colposcopy benign         OB History    Gravida  0   Para  0   Term  0   Preterm  0   AB  0   Living  0     SAB  0   TAB  0   Ectopic  0   Multiple  0   Live Births  0              Patient Active Problem List   Diagnosis Date Noted  . Genetic testing 05/20/2018  . BRCA1 gene mutation positive 05/20/2018  . Family history of ovarian cancer   . Family history of breast cancer   . Family history of BRCA1 gene positive 01/13/2016  . Low grade squamous intraepithelial lesion on cytologic smear of cervix (LGSIL) 01/13/2016  . Migraine with aura and without status migrainosus, not intractable 01/13/2016    Past Medical History:  Diagnosis Date  . BRCA1 positive 2019  . Family history of BRCA gene mutation    patient's mother - BRCA1 positive  . Family history of breast cancer   . Family history of ovarian cancer   . Migraine    no apparent aura    Past Surgical History:  Procedure Laterality Date  . KNEE SURGERY Right 2012    Current Outpatient Medications  Medication Sig Dispense Refill  . Aspirin-Acetaminophen-Caffeine (MIGRAINE RELIEF PO) Take by mouth as needed.    . medroxyPROGESTERone (DEPO-PROVERA) 150 MG/ML injection  as directed.  3   No current facility-administered medications for this visit.      ALLERGIES: Amoxicillin; Cefaclor; and Sulfa antibiotics  Family History  Problem Relation Age of Onset  . Ovarian cancer Mother 49       BRCA positive  . BRCA 1/2 Mother        BRCA1  . Breast cancer Maternal Grandmother        d. 26  . BRCA 1/2 Sister        BRCA1 pos  . BRCA 1/2 Sister        BRCA1 pos  . Uterine cancer Other        MGMs sister    Social History   Socioeconomic History  . Marital status: Single    Spouse name: Not on file  . Number of children: Not on file  . Years of education: Not on file  . Highest education level: Not on file  Occupational History  . Not on file  Social Needs  . Financial resource strain: Not on file  . Food insecurity:    Worry: Not on file    Inability: Not  on file  . Transportation needs:    Medical: Not on file    Non-medical: Not on file  Tobacco Use  . Smoking status: Never Smoker  . Smokeless tobacco: Never Used  Substance and Sexual Activity  . Alcohol use: Never    Frequency: Never  . Drug use: Not Currently  . Sexual activity: Yes    Birth control/protection: Injection  Lifestyle  . Physical activity:    Days per week: Not on file    Minutes per session: Not on file  . Stress: Not on file  Relationships  . Social connections:    Talks on phone: Not on file    Gets together: Not on file    Attends religious service: Not on file    Active member of club or organization: Not on file    Attends meetings of clubs or organizations: Not on file    Relationship status: Not on file  . Intimate partner violence:    Fear of current or ex partner: Not on file    Emotionally abused: Not on file    Physically abused: Not on file    Forced sexual activity: Not on file  Other Topics Concern  . Not on file  Social History Narrative  . Not on file    Review of Systems  Constitutional: Negative.   HENT: Negative.   Eyes:  Negative.   Respiratory: Negative.   Cardiovascular: Negative.   Gastrointestinal: Negative.   Endocrine: Negative.   Genitourinary: Negative.   Musculoskeletal: Negative.   Skin: Negative.   Allergic/Immunologic: Negative.   Neurological: Negative.   Hematological: Negative.   Psychiatric/Behavioral: Negative.     PHYSICAL EXAMINATION:    BP (!) 110/58 (BP Location: Right Arm, Patient Position: Sitting, Cuff Size: Normal)   Pulse 76   Resp 14   Ht 5' 1"  (1.549 m)   Wt 128 lb (58.1 kg)   BMI 24.19 kg/m     General appearance: alert, cooperative and appears stated age  ASSESSMENT   Positive BRCA1 status.   PLAN  We discussed her genetic mutation and increased lifetime risk of breast and ovarian cancer.  We reviewed guidelines for twice yearly clinical breast exam, and yearly mammogram and breast magnetic resonance imaging.  We will get a baseline pelvic ultrasound and CA125 and continue with this yearly starting at age 89, or sooner if indicated.  Risk reduction bilateral salpingo-oophorectomy with completion of childbearing or from age 51 - 74 yo reviewed.  Combined oral contraceptive use to reduce risk of ovarian cancer in BRCA patients discussed. We talked about no current guideline for use of Depo Provera for BRCA patients.  She will start COCs now instead of continuing on Depo Provera.  We discussed warning signs of stroke, MI, PE, and DVT. She has an appointment with Dr. Jana Hakim at the [facility] to discuss her status further.  I also mentioned consultation with GYN ONC if desired.  Questions invited and answered.   An After Visit Summary was printed and given to the patient.  __25____ minutes face to face time of which over 50% was spent in counseling.

## 2018-06-22 ENCOUNTER — Telehealth: Payer: Self-pay | Admitting: *Deleted

## 2018-06-22 NOTE — Telephone Encounter (Signed)
Left message to call Sharee Pimple, RN at [facility].    Schedule PUS with Dr. Betsey Holiday to schedule at any time.  She is transitioning to birth control pills.  If she is off all hormonal contraception, then the scan needs to be done between day 1 - 10.

## 2018-06-23 NOTE — Telephone Encounter (Signed)
Patient returned call

## 2018-06-23 NOTE — Telephone Encounter (Signed)
Returned call to patient. Patient states that she started the [place] on Monday. Did not have cycles while on Depo-Provera. PUS scheduled for 07-21-18 at 0830 with 0900 consult with Dr. Quincy Simmonds. Patient declined earlier Thursday appointment due to work schedule. Patient states she is currently unsure of her January work schedule, but will return call to office if she needs to reschedule.  Routing to provider and will close encounter.

## 2018-07-04 NOTE — Progress Notes (Signed)
[place]  [phone] [phone]     ID: [name] DOB: [birth year]  MR#: [id number]  UQJ#:[id number]  Patient Care Team: Patient, No Pcp Per as PCP - General (General Practice) Magrinat, Virgie Dad, MD as Consulting Physician (Oncology) Nunzio Cobbs, MD as Consulting Physician (Obstetrics and Gynecology) OTHER MD:   CHIEF COMPLAINT: Breast cancer high risk, BRCA1 positive   CURRENT TREATMENT: Intensified screening, OCP   HISTORY OF CURRENT ILLNESS: Rachel Stein (pronounced "PON-sell") was referred to the high risk cancer clinic by Dr. Quincy Simmonds due to a family history of breast, uterine, and ovarian cancer and a deleterious BRCA1 mutation.   The patient underwent PAP ([id number]) on 04/01/2018 showing high risk HPV. The biopsy was negative for intraepithelial lesions or malignancy.   She underwent colposcopy ([id number]) on 05/04/2018, showing fragmented benign cervical transformation zone mucosa. There is no dysplasia or malignancy identified in the endocervix, curettage. At 6 o'clock in the cervix and at 12 o'clock in the cervix, cervical transformation zone mucosa with mild inflammation and reactive epithelial changes were found. There is no dysplasia or malignancy identified. For the two cervix biopsies, the diagnoses were supported with Ki-67 and p16 immunohistochemistry and the findings correlate with the previous Pap test results ([id number]).  The patient was evaluated in our genetics clinic 05/11/2018 with the results 05/20/2018 showing a heterozygous variant identified in [place], associated with autosomal dominant hereditary breast and ovarian cancer syndrome. The deleterious mutation in BRCA1 is found at (Exons 1-2).   The patient's subsequent history is as detailed below.   INTERVAL HISTORY: Rachel Stein was evaluated in the high risk breast cancer clinic on 07/05/2018   REVIEW OF SYSTEMS: Rachel Stein is doing well  overall. She has not noticed a change in either breast. For exercise, she occasionally goes to workout classes that [facility] offers. She does not follow any particular dietary habits or restrictions. She has a history of migraines, which she treats with ibuprofen. She originally took trokendi, which she said worked very well, but her insurance stopped covering it.  She reports no side effects were problems associated with that medication.  The patient denies visual changes, nausea, vomiting, stiff neck, dizziness, or gait imbalance. There has been no cough, phlegm production, or pleurisy, no chest pain or pressure, and no change in bowel or bladder habits. The patient denies fever, rash, bleeding, unexplained fatigue or unexplained weight loss. A detailed review of systems was otherwise entirely negative.   PAST MEDICAL HISTORY: Past Medical History:  Diagnosis Date  . BRCA1 positive 2019  . Family history of BRCA gene mutation    patient's mother - BRCA1 positive  . Family history of breast cancer   . Family history of ovarian cancer   . Migraine    no apparent aura     PAST SURGICAL HISTORY: Past Surgical History:  Procedure Laterality Date  . ADENOIDECTOMY    . KNEE SURGERY Right 2012  . TONSILLECTOMY       FAMILY HISTORY: Family History  Problem Relation Age of Onset  . Ovarian cancer Mother 56       BRCA positive  . BRCA 1/2 Mother        BRCA1  . Breast cancer Maternal Stein        d. 47  . BRCA 1/2 Sister        BRCA1 pos  . BRCA 1/2 Sister        BRCA1 pos  . Uterine cancer Other  MGMs sister   Rachel Stein is 40. Patients' mother is 31, was diagnosed with ovarian cancer at age 81 and is BRCA 1 positive.  She has a half brother about whom there is no information.  The patient has 2 sisters who are also both BRCA 1 positive.  They are in their late 60s with no history of breast cancer.  Rachel Stein had breast cancer diagnosed at  age 52. Rachel Stein had both uterine and breast cancers, age at diagnosis unknown (this is 34 of the patient's maternal Stein's 3 sisters).  On the paternal side there is no cancer recorded.  The patient's Stein has 2 brothers and a sister and a half sister.   GYNECOLOGIC HISTORY:  No LMP recorded. Patient has had an injection. 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 She works about three days a week. She is single and lives alone. She has a cat named [name].    ADVANCED DIRECTIVES: To be discussed   HEALTH MAINTENANCE: Social History   Tobacco Use  . Smoking status: Never Smoker  . Smokeless tobacco: Never Used  Substance Use Topics  . Alcohol use: Never    Frequency: Never  . Drug use: Not Currently     Colonoscopy: no  PAP: up to date/[facility]  Bone density: no   Allergies  Allergen Reactions  . Amoxicillin Rash and Swelling  . Cefaclor Rash and Swelling  . Sulfa Antibiotics Rash and Swelling    Current Outpatient Medications  Medication Sig Dispense Refill  . Aspirin-Acetaminophen-Caffeine (MIGRAINE RELIEF PO) Take by mouth as needed.    Rachel Stein Kitchen levonorgestrel-ethinyl estradiol (SEASONALE,INTROVALE,JOLESSA) 0.15-0.03 MG tablet Take 1 tablet by mouth daily. 1 Package 1  . Topiramate ER (TROKENDI XR) 50 MG CP24 Take 1 tablet by mouth daily. 90 capsule 4   No current facility-administered medications for this visit.      OBJECTIVE: Young white woman who appears well  Vitals:   07/05/18 1500  BP: 108/65  Pulse: (!) 102  Resp: 18  Temp: 98.3 F (36.8 C)  SpO2: 100%     Body mass index is 24.88 kg/m.   Wt Readings from Last 3 Encounters:    07/05/18 131 lb 11.2 oz (59.7 kg)  06/20/18 128 lb (58.1 kg)  05/04/18 130 lb (59 kg)      ECOG FS:0 - Asymptomatic  Ocular: Sclerae unicteric, pupils round and equal Ear-nose-throat: Oropharynx clear and moist Lymphatic: No cervical or supraclavicular adenopathy Lungs no rales or rhonchi Heart regular rate and rhythm, no murmur appreciated Abd soft, nontender, positive bowel sounds, no masses palpated MSK no focal spinal tenderness, no joint edema Neuro: non-focal, well-oriented, positive affect Breasts: No masses palpated, no skin or nipple changes of concern.  Both axillae are benign   LAB RESULTS:  CMP     Component Value Date/Time   NA 140 04/01/2018 1135   K 4.6 04/01/2018 1135   CL 102 04/01/2018 1135   CO2 26 04/01/2018 1135   GLUCOSE 77 04/01/2018 1135   BUN 12 04/01/2018 1135   CREATININE 0.81 04/01/2018 1135   CALCIUM 10.1 04/01/2018 1135   PROT 7.1 04/01/2018 1135   ALBUMIN 5.0 04/01/2018 1135   AST 19 04/01/2018  1135   ALT 12 04/01/2018 1135   ALKPHOS 41 04/01/2018 1135   BILITOT 0.4 04/01/2018 1135   GFRNONAA 103 04/01/2018 1135   GFRAA 118 04/01/2018 1135    No results found for: TOTALPROTELP, ALBUMINELP, A1GS, A2GS, BETS, BETA2SER, GAMS, MSPIKE, SPEI  No results found for: KPAFRELGTCHN, LAMBDASER, KAPLAMBRATIO  Lab Results  Component Value Date   WBC 7.3 04/01/2018   HGB 13.1 04/01/2018   HCT 38.7 04/01/2018   MCV 89 04/01/2018   PLT 299 04/01/2018    _0 @  No results found for: LABCA2  No components found for: [id number]  No results for input(s): INR in the last 168 hours.  No results found for: LABCA2  No results found for: HWT888  No results found for: KCM034  No results found for: JZP915  No results found for: CA2729  No components found for: HGQUANT  No results found for: CEA1 / No results found for: CEA1   No results found for: AFPTUMOR  No results found for: CHROMOGRNA  No results found for: PSA1  No  visits with results within 3 Day(s) from this visit.  Latest known visit with results is:  Office Visit on 05/04/2018  Component Date Value Ref Range Status  . Preg Test, Ur 05/04/2018 Negative  Negative Final    (this displays the last labs from the last 3 days)  No results found for: TOTALPROTELP, ALBUMINELP, A1GS, A2GS, BETS, BETA2SER, GAMS, MSPIKE, SPEI (this displays SPEP labs)  No results found for: KPAFRELGTCHN, LAMBDASER, KAPLAMBRATIO (kappa/lambda light chains)  No results found for: HGBA, HGBA2QUANT, HGBFQUANT, HGBSQUAN (Hemoglobinopathy evaluation)   No results found for: LDH  No results found for: IRON, TIBC, IRONPCTSAT (Iron and TIBC)  No results found for: FERRITIN  Urinalysis No results found for: COLORURINE, APPEARANCEUR, LABSPEC, PHURINE, GLUCOSEU, HGBUR, BILIRUBINUR, KETONESUR, PROTEINUR, UROBILINOGEN, NITRITE, LEUKOCYTESUR   STUDIES:  No results found.   ELIGIBLE FOR AVAILABLE RESEARCH PROTOCOL: no   ASSESSMENT: 22 y.o. [place], [place] woman with a deleterious BRCA1 mutation (deletion exons 1-2)  (1) no additional deleterious mutations were noted through the Common Hereditary Cancer Panel offered by [facility] in APC, ATM, AXIN2, BARD1, BMPR1A, BRCA2, BRIP1, CDH1, CDK4, CDKN2A, CHEK2, CTNNA1, DICER1, EPCAM, GREM1, HOXB13, KIT, MEN1, MLH1, MSH2, MSH3, MSH6, MUTYH, NBN, NF1, NTHL1, PALB2, PDGFRA, PMS2, POLD1, POLE, PTEN, RAD50, RAD51C, RAD51D, SDHA, SDHB, SDHC, SDHD, SMAD4, SMARCA4. STK11, TP53, TSC1, TSC2, and VHL .  (2) ovarian cancer high risk:  (a) continue combined progesterone/estrogen contraceptives, which reduce risk by approximately one half  (b) consider bilateral salpingo-oophorectomy (likely also with hysterectomy given family history) around age 51 if no further childbearing planned  (c) consider biannual TVUS starting at age 6 or earlier as per GYN   (3) pancreatic cancer increased risk:  (a) consider yearly MRCP if family history of pancreatic  cancer develops  (4) breast cancer high risk:  (a) initiate intensified screening now with yearly breast MRI (DEC), yearly mammography (JUN) , and biannual MD breast exam   (b) consider bilateral mastectomies if and when breast cancer is detected  (c) optimize diet and exercise as discussed   PLAN: I spent approximately 60 minutes face to face with Shanekia with more than 50% of that time spent in counseling and coordination of care. Specifically we reviewed the biology of the patient's diagnosis and the specifics of her situation.  She is well aware of the risks for the various cancers associated with BRCA1 mutations.  It is interesting that in her family  we do not see any pancreatic cancer or prostate cancer.  Note there is very little information regarding the patient's mother's half-brother.  Rachel Stein understands that we do not have screening for ovarian or pancreatic cancer that has been proven to save lives. Nevertheless there are screening programs available and she is going to have a baseline transvaginal ultrasound through Dr. Quincy Simmonds in January, which with eventual by annual ultrasonography.  In addition the patient has been switched to a level norgestrel/ethynyl estradiol contraceptive which should significantly reduce the risk of ovarian cancer (to relative risk of 0.5 or so).  We also have a yearly MRCP pancreatic screening program available, which I recommend only in BRCA positive cases with a positive family history for pancreatic cancer  With regards to breast cancer she has the option of bilateral mastectomies.  We discussed the fact that there is no difference in survival between bilateral mastectomies and intensified screening, and that I would recommend reserving consideration of bilateral mastectomies to such a time as when her screening program detects an early breast cancer.  There are concerns regarding the use of oral contraceptives and breast cancer risk, but the data from  studies in BRCA positive patient specifically have not confirmed this.  In the meantime I recommended a diet low in carbohydrates and exercise for 45 minutes 5 times a week.  This can reduce general cancer risk by as much as 2-3%.  Implementing the above, Rachel Stein will have breast MRI this month, she will have a transvaginal ultrasound through Dr. Quincy Simmonds in January.  She will see me again in March, she will have mammography in June, and she will see Dr. Quincy Simmonds again in September.  We can continue this pattern indefinitely until the patient completes family-planning and or cancer develops  Also Rachel Stein does have significant migraines which can affect her ability to work.  I went ahead and wrote for long-acting topiramate for her.  She is aware of the possible toxicities, side effects and complications of this agent which she has had before with no complications.  Adriann has a good understanding of the overall plan. She agrees with it. She knows the goal of treatment in her case is prevention. She will call with any problems that may develop before her next visit here.   Magrinat, Virgie Dad, MD  07/05/18 5:06 PM Medical Oncology and Hematology [facility] [address] [place], [place] [address] Tel. [phone]    Fax. [phone]    I, Jacqualyn Posey am acting as a Education administrator for Chauncey Cruel, MD.   I, Lurline Del MD, have reviewed the above documentation for accuracy and completeness, and I agree with the above.

## 2018-07-05 ENCOUNTER — Inpatient Hospital Stay: Payer: 59 | Attending: Genetic Counselor | Admitting: Oncology

## 2018-07-05 ENCOUNTER — Encounter: Payer: Self-pay | Admitting: Oncology

## 2018-07-05 VITALS — BP 108/65 | HR 102 | Temp 98.3°F | Resp 18 | Ht 61.0 in | Wt 131.7 lb

## 2018-07-05 DIAGNOSIS — G43909 Migraine, unspecified, not intractable, without status migrainosus: Secondary | ICD-10-CM | POA: Diagnosis not present

## 2018-07-05 DIAGNOSIS — Z8041 Family history of malignant neoplasm of ovary: Secondary | ICD-10-CM

## 2018-07-05 DIAGNOSIS — Z9189 Other specified personal risk factors, not elsewhere classified: Secondary | ICD-10-CM | POA: Insufficient documentation

## 2018-07-05 DIAGNOSIS — Z1231 Encounter for screening mammogram for malignant neoplasm of breast: Secondary | ICD-10-CM

## 2018-07-05 DIAGNOSIS — Z1501 Genetic susceptibility to malignant neoplasm of breast: Secondary | ICD-10-CM

## 2018-07-05 DIAGNOSIS — Z1509 Genetic susceptibility to other malignant neoplasm: Secondary | ICD-10-CM

## 2018-07-05 DIAGNOSIS — Z793 Long term (current) use of hormonal contraceptives: Secondary | ICD-10-CM

## 2018-07-05 DIAGNOSIS — Z7183 Encounter for nonprocreative genetic counseling: Secondary | ICD-10-CM

## 2018-07-05 DIAGNOSIS — Z803 Family history of malignant neoplasm of breast: Secondary | ICD-10-CM | POA: Diagnosis not present

## 2018-07-05 MED ORDER — TOPIRAMATE ER 50 MG PO CAP24: 1.0000 | ORAL_CAPSULE | Freq: Every day | ORAL | 4 refills | Status: DC

## 2018-07-21 ENCOUNTER — Other Ambulatory Visit: Payer: Self-pay

## 2018-07-21 ENCOUNTER — Ambulatory Visit (INDEPENDENT_AMBULATORY_CARE_PROVIDER_SITE_OTHER): Payer: 59 | Admitting: Obstetrics and Gynecology

## 2018-07-21 ENCOUNTER — Encounter: Payer: Self-pay | Admitting: Obstetrics and Gynecology

## 2018-07-21 ENCOUNTER — Ambulatory Visit (INDEPENDENT_AMBULATORY_CARE_PROVIDER_SITE_OTHER): Payer: 59

## 2018-07-21 VITALS — BP 100/60 | HR 70 | Ht 61.0 in | Wt 132.6 lb

## 2018-07-21 DIAGNOSIS — Z1501 Genetic susceptibility to malignant neoplasm of breast: Secondary | ICD-10-CM

## 2018-07-21 DIAGNOSIS — G43109 Migraine with aura, not intractable, without status migrainosus: Secondary | ICD-10-CM | POA: Diagnosis not present

## 2018-07-21 DIAGNOSIS — Z1509 Genetic susceptibility to other malignant neoplasm: Secondary | ICD-10-CM

## 2018-07-21 NOTE — Assessment & Plan Note (Signed)
Patient denies history of aura at visit on 06/20/18.

## 2018-07-21 NOTE — Progress Notes (Signed)
GYNECOLOGY  VISIT   HPI: 23 y.o.   Single  Caucasian  female   G0P0000 with No LMP recorded.here for pelvic ultrasound. Pt.was on Depo Provera and wasn't having cycles, so she can't remember last date of menses.  Has BRCA1 mutation and presents for baseline pelvic US.   Saw Dr. Jana Hakim in follow up to her positive BRCA1 status.  Switched from Depo Provera to COCs.  Some nausea when she first started her pills.  Forgot one pill.   GYNECOLOGIC HISTORY: No LMP recorded. Contraception:  OCPs--Seasonale Menopausal hormone therapy:  none Last mammogram:  n/a Last pap smear:    04-01-18 negative, HR HPV positive. 05-04-18- colposcopy benign         OB History    Gravida  0   Para  0   Term  0   Preterm  0   AB  0   Living  0     SAB  0   TAB  0   Ectopic  0   Multiple  0   Live Births  0              Patient Active Problem List   Diagnosis Date Noted  . At high risk for breast cancer 07/05/2018  . High risk of ovarian cancer 07/05/2018  . Genetic testing 05/20/2018  . BRCA1 gene mutation positive 05/20/2018  . Family history of ovarian cancer   . Family history of breast cancer   . Family history of BRCA1 gene positive 01/13/2016  . Low grade squamous intraepithelial lesion on cytologic smear of cervix (LGSIL) 01/13/2016  . Migraine with aura and without status migrainosus, not intractable 01/13/2016    Past Medical History:  Diagnosis Date  . BRCA1 positive 2019  . Family history of BRCA gene mutation    patient's mother - BRCA1 positive  . Family history of breast cancer   . Family history of ovarian cancer   . Migraine    no apparent aura    Past Surgical History:  Procedure Laterality Date  . ADENOIDECTOMY    . KNEE SURGERY Right 2012  . TONSILLECTOMY      Current Outpatient Medications  Medication Sig Dispense Refill  . Aspirin-Acetaminophen-Caffeine (MIGRAINE RELIEF PO) Take by mouth as needed.    Marland Kitchen levonorgestrel-ethinyl estradiol  (SEASONALE,INTROVALE,JOLESSA) 0.15-0.03 MG tablet Take 1 tablet by mouth daily. 1 Package 1  . Topiramate ER (TROKENDI XR) 50 MG CP24 Take 1 tablet by mouth daily. 90 capsule 4   No current facility-administered medications for this visit.      ALLERGIES: Amoxicillin; Cefaclor; and Sulfa antibiotics  Family History  Problem Relation Age of Onset  . Ovarian cancer Mother 44       BRCA positive  . BRCA 1/2 Mother        BRCA1  . Breast cancer Maternal Grandmother        d. 73  . BRCA 1/2 Sister        BRCA1 pos  . BRCA 1/2 Sister        BRCA1 pos  . Uterine cancer Other        MGMs sister    Social History   Socioeconomic History  . Marital status: Single    Spouse name: Not on file  . Number of children: Not on file  . Years of education: Not on file  . Highest education level: Not on file  Occupational History  . Not on file  Social Needs  .  Financial resource strain: Not on file  . Food insecurity:    Worry: Not on file    Inability: Not on file  . Transportation needs:    Medical: Not on file    Non-medical: Not on file  Tobacco Use  . Smoking status: Never Smoker  . Smokeless tobacco: Never Used  Substance and Sexual Activity  . Alcohol use: Never    Frequency: Never  . Drug use: Not Currently  . Sexual activity: Yes    Birth control/protection: Injection  Lifestyle  . Physical activity:    Days per week: Not on file    Minutes per session: Not on file  . Stress: Not on file  Relationships  . Social connections:    Talks on phone: Not on file    Gets together: Not on file    Attends religious service: Not on file    Active member of club or organization: Not on file    Attends meetings of clubs or organizations: Not on file    Relationship status: Not on file  . Intimate partner violence:    Fear of current or ex partner: Not on file    Emotionally abused: Not on file    Physically abused: Not on file    Forced sexual activity: Not on file   Other Topics Concern  . Not on file  Social History Narrative  . Not on file    Review of Systems  All other systems reviewed and are negative.   PHYSICAL EXAMINATION:    BP 100/60 (BP Location: Right Arm, Patient Position: Sitting, Cuff Size: Normal)   Pulse 70   Ht _0  (1.549 m)   Wt 132 lb 9.6 oz (60.1 kg)   BMI 25.05 kg/m     General appearance: alert, cooperative and appears stated age  Pelvic US Uterus no masses.  EMS 4.25 mm.  Normal ovaries.  No free fluid.  Chaperone was present for exam.  ASSESSMENT  BRCA1 mutation.  Hx migraine without aura.  Hx positive HR HPV.  PLAN  We discussed her normal pelvic ultrasound.  Will plan for twice yearly pelvic US starting age 45 unless patient has symptoms that merit doing this sooner.  We reviewed signs and symptoms of ovarian cancer.  BSO at age 39 yo.  She will have a breast MRI and then mammogram.  FU with me in March for [facility] recheck.  Annual exam with me in September.  She will also see Dr. Jana Hakim in March for her recheck.  We discussed that her HR HPV is unrelated to her BRCA1 mutation.    An After Visit Summary was printed and given to the patient.  _15____ minutes face to face time of which over 50% was spent in counseling.

## 2018-07-21 NOTE — Progress Notes (Signed)
Encounter reviewed by Dr. Brook Amundson C. Silva.  

## 2018-07-27 ENCOUNTER — Ambulatory Visit
Admission: RE | Admit: 2018-07-27 | Discharge: 2018-07-27 | Disposition: A | Discharge status: Home / Self Care | Payer: 59 | Source: Ambulatory Visit | Attending: Oncology | Admitting: Oncology

## 2018-07-27 DIAGNOSIS — Z9189 Other specified personal risk factors, not elsewhere classified: Secondary | ICD-10-CM

## 2018-07-27 DIAGNOSIS — Z1231 Encounter for screening mammogram for malignant neoplasm of breast: Secondary | ICD-10-CM

## 2018-07-27 DIAGNOSIS — Z1509 Genetic susceptibility to other malignant neoplasm: Secondary | ICD-10-CM

## 2018-07-27 DIAGNOSIS — R922 Inconclusive mammogram: Secondary | ICD-10-CM | POA: Diagnosis not present

## 2018-07-27 DIAGNOSIS — Z803 Family history of malignant neoplasm of breast: Secondary | ICD-10-CM | POA: Diagnosis not present

## 2018-07-27 DIAGNOSIS — Z1501 Genetic susceptibility to malignant neoplasm of breast: Secondary | ICD-10-CM

## 2018-09-14 MED FILL — SETLAKIN 0.15 MG-0.03 MG TA: 0.15-0.03 | 91 days supply | Qty: 91 | Fill #1

## 2018-09-16 ENCOUNTER — Ambulatory Visit: Payer: 59 | Admitting: Nurse Practitioner

## 2018-09-16 ENCOUNTER — Encounter: Payer: Self-pay | Admitting: Nurse Practitioner

## 2018-09-16 VITALS — BP 110/70 | HR 69 | Temp 98.4°F | Ht 61.0 in | Wt 138.4 lb

## 2018-09-16 DIAGNOSIS — G43009 Migraine without aura, not intractable, without status migrainosus: Secondary | ICD-10-CM | POA: Diagnosis not present

## 2018-09-16 NOTE — Progress Notes (Signed)
Subjective:  Patient ID: Rachel Stein, female    DOB: 1996/01/14  Age: 23 y.o. MRN: [id number]  CC: Establish Care (est care/migrains--stress---going on for a long time/ibuprofen--)   HPI  Chronic Migraine without Aura: Onset at age 71 Experience headache daily, varies from 2-10 on pain scale, today it is at 2/10. Location: temple, no radiation Describes as stabbing, no aura Associated symptoms: photophobia and fatigue Trigger: bright light and stress Social use of ETOH, no tobacco use, no illicit substance use. Drinks about 1.2cups (8oz) of coffee per day. No change in migraine with OCP and depoprovera. Denies any head injury, no seizure, no tumor FHx of migraine (father and sister), no Fhx of brian tumor Reports last MRI Brain done while in high school: no acute finding per patient States she has tried multiple medications in past, but does not remember names. Migraines have only been controlled by Topamax Er, but unable to afford due to high cost. Has not take topamax Er since 06/2017 due to high cost. For abortive therapy no improvement with triptans or tylenol. Has some improvement with ibuprofen 823m once daily as needed.  Denies any anxiety or depression  Reviewed past Medical, Social and Family history today.  Outpatient Medications Prior to Visit  Medication Sig Dispense Refill  . levonorgestrel-ethinyl estradiol (SEASONALE,INTROVALE,JOLESSA) 0.15-0.03 MG tablet Take 1 tablet by mouth daily. 1 Package 1  . Aspirin-Acetaminophen-Caffeine (MIGRAINE RELIEF PO) Take by mouth as needed.    . Topiramate ER (TROKENDI XR) 50 MG CP24 Take 1 tablet by mouth daily. (Patient not taking: Reported on 09/16/2018) 90 capsule 4   No facility-administered medications prior to visit.     ROS See HPI  Objective:  BP 110/70   Pulse 69   Temp 98.4 F (36.9 C) (Oral)   Ht 5' 1"  (1.549 m)   Wt 138 lb 6.4 oz (62.8 kg)   SpO2 98%   BMI 26.15 kg/m   BP Readings from  Last 3 Encounters:  09/16/18 110/70  07/21/18 100/60  07/05/18 108/65    Wt Readings from Last 3 Encounters:  09/16/18 138 lb 6.4 oz (62.8 kg)  07/21/18 132 lb 9.6 oz (60.1 kg)  07/05/18 131 lb 11.2 oz (59.7 kg)    Physical Exam Vitals signs reviewed.  HENT:     Right Ear: Ear canal and external ear normal. Tympanic membrane is scarred. Tympanic membrane is not injected or erythematous.     Left Ear: Ear canal and external ear normal. Tympanic membrane is scarred. Tympanic membrane is not injected or erythematous.  Neck:     Musculoskeletal: Normal range of motion and neck supple.  Cardiovascular:     Rate and Rhythm: Normal rate and regular rhythm.     Pulses: Normal pulses.     Heart sounds: Normal heart sounds.  Pulmonary:     Effort: Pulmonary effort is normal.     Breath sounds: Normal breath sounds.  Lymphadenopathy:     Cervical: No cervical adenopathy.  Neurological:     Mental Status: She is alert and oriented to person, place, and time.     Coordination: Coordination normal.    Lab Results  Component Value Date   WBC 7.3 04/01/2018   HGB 13.1 04/01/2018   HCT 38.7 04/01/2018   PLT 299 04/01/2018   GLUCOSE 77 04/01/2018   CHOL 173 04/01/2018   TRIG 62 04/01/2018   HDL 68 04/01/2018   LDLCALC 93 04/01/2018   ALT 12 04/01/2018   AST  19 04/01/2018   NA 140 04/01/2018   K 4.6 04/01/2018   CL 102 04/01/2018   CREATININE 0.81 04/01/2018   BUN 12 04/01/2018   CO2 26 04/01/2018    Mm Diag Breast Tomo Bilateral  Result Date: 07/27/2018 CLINICAL DATA:  23 year old with family history of breast cancer in her maternal grandmother who died at age 79 and family history of ovarian cancer in her mother. The patient, her mother and her sister all carry the BRCA 1 gene mutation. High risk evaluation is performed. This is the patient's initial baseline mammogram. EXAM: DIGITAL DIAGNOSTIC BILATERAL MAMMOGRAM WITH CAD AND TOMO COMPARISON:  None. ACR Breast Density Category  c: The breast tissue is heterogeneously dense, which may obscure small masses. FINDINGS: Tomosynthesis and synthesized full field CC and MLO views of both breasts were obtained. No findings suspicious for malignancy in either breast. Mammographic images were processed with CAD. IMPRESSION: No mammographic evidence of malignancy involving either breast. RECOMMENDATION: 1. Annual high risk screening mammography. 2. Supplemental high risk screening MRI, ideally performed 6 months after the screening mammogram. I have discussed the findings and recommendations with the patient. Results were also provided in writing at the conclusion of the visit. If applicable, a reminder letter will be sent to the patient regarding the next appointment. BI-RADS CATEGORY  1: Negative. Electronically Signed   By: Evangeline Dakin M.D.   On: 07/27/2018 11:04    Assessment & Plan:   Rachel Stein was seen today for establish care.  Diagnoses and all orders for this visit:  Migraine without aura and without status migrainosus, not intractable   I am having Rachel Stein maintain her Aspirin-Acetaminophen-Caffeine (MIGRAINE RELIEF PO), levonorgestrel-ethinyl estradiol, and Topiramate ER.  No orders of the defined types were placed in this encounter.   Problem List Items Addressed This Visit      Cardiovascular and Mediastinum   Migraine headache without aura - Primary    Onset at age 89 Experience headache daily, varies from 2-10 on pain scale, today it is at 2/10. Location: temple, no radiation Describes as stabbing, no aura Associated symptoms: photophobia and fatigue Trigger: bright light and stress Social use of ETOH, no tobacco use, no illicit substance use. Drinks about 1.2cups (8oz) of coffee per day. No change in migraine with OCP and depoprovera. Denies any head injury, no seizure, no tumor FHx of migraine (father and sister), no Fhx of brian tumor Reports last MRI Brain done while in high school: no  acute finding per patient States she has tried multiple medications in past, but does not remember names. Migraines have only been controlled by Topamax Er, but unable to afford due to high cost. Has not take topamax Er since 06/2017 due to high cost. For abortive therapy no improvement with triptans or tylenol. Has some improvement with ibuprofen 829m once daily as needed.  Obtain records from previous neurology. Continue ibuprofen prn to manage headache. Use Blue glass to limit light from electronics Implement ways to minimize stress.           Follow-up: Return if symptoms worsen or fail to improve.  CWilfred Lacy NP

## 2018-09-16 NOTE — Assessment & Plan Note (Addendum)
Onset at age 23 Experience headache daily, varies from 2-10 on pain scale, today it is at 2/10. Location: temple, no radiation Describes as stabbing, no aura Associated symptoms: photophobia and fatigue Trigger: bright light and stress Social use of ETOH, no tobacco use, no illicit substance use. Drinks about 1.2cups (8oz) of coffee per day. No change in migraine with OCP and depoprovera. Denies any head injury, no seizure, no tumor FHx of migraine (father and sister), no Fhx of brian tumor Reports last MRI Brain done while in high school: no acute finding per patient States she has tried multiple medications in past, but does not remember names. Migraines have only been controlled by Topamax Er, but unable to afford due to high cost. Has not take topamax Er since 06/2017 due to high cost. For abortive therapy no improvement with triptans or tylenol. Has some improvement with ibuprofen 800mg  once daily as needed.  Obtain records from previous neurology. Continue ibuprofen prn to manage headache. Use Blue glass to limit light from electronics Implement ways to minimize stress.

## 2018-09-16 NOTE — Patient Instructions (Addendum)
please sign medical release to get records from previous neurology.  Use Glue Glass to limit blue light from electronics.  Make appt with GYN for CPE in September.  Try exercise or massage therapy to help with work and social life stressors.  Continue ibuprofen 800mg  every 12hrs prn for acute migraines (with food).

## 2018-09-19 ENCOUNTER — Ambulatory Visit: Payer: 59 | Admitting: Obstetrics and Gynecology

## 2018-09-22 NOTE — Progress Notes (Signed)
GYNECOLOGY  VISIT   HPI: 23 y.o.   Single  Caucasian  female   [id number] with Patient's last menstrual period was 09/16/2018 (exact date).   here for 3 month follow up on OCPs. Patient states doing well.    Had one menses so far.  Did have some cramping with her cycle.  Did not need pain medication.  (Previously she was on Depo Provera and was not having cycles.)   One or two pills late or missed. No abnormal bleeding following this.   Had some nausea at first and when she missed a pill.   No change in migraines with use of COCs.  No migraine during her menses.  Has not started on Topamax yet.  Was prescribed a low dose of 50 mg.  Not sexually active.   No concerns with her pills and wants to continue.   GYNECOLOGIC HISTORY: Patient's last menstrual period was 09/16/2018 (exact date). Contraception: OCPS--Seasonale Menopausal hormone therapy:  none Last mammogram: n/a Last pap smear: 04-01-18 negative, HR HPV positive. 05-04-18- colposcopy benign         OB History    Gravida  0   Para  0   Term  0   Preterm  0   AB  0   Living  0     SAB  0   TAB  0   Ectopic  0   Multiple  0   Live Births  0              Patient Active Problem List   Diagnosis Date Noted  . At high risk for breast cancer 07/05/2018  . High risk of ovarian cancer 07/05/2018  . Genetic testing 05/20/2018  . BRCA1 gene mutation positive 05/20/2018  . Family history of ovarian cancer   . Family history of breast cancer   . Family history of BRCA1 gene positive 01/13/2016  . Low grade squamous intraepithelial lesion on cytologic smear of cervix (LGSIL) 01/13/2016  . Migraine headache without aura 01/13/2016    Past Medical History:  Diagnosis Date  . Asthma   . BRCA1 positive 2019  . Family history of BRCA gene mutation    patient's mother - BRCA1 positive  . Family history of breast cancer   . Family history of ovarian cancer   . Migraine    no apparent aura    Past  Surgical History:  Procedure Laterality Date  . ADENOIDECTOMY    . KNEE SURGERY Right 2012  . TONSILLECTOMY      Current Outpatient Medications  Medication Sig Dispense Refill  . Aspirin-Acetaminophen-Caffeine (MIGRAINE RELIEF PO) Take by mouth as needed.    Marland Kitchen levonorgestrel-ethinyl estradiol (SEASONALE,INTROVALE,JOLESSA) 0.15-0.03 MG tablet Take 1 tablet by mouth daily. 1 Package 1  . Topiramate ER (TROKENDI XR) 50 MG CP24 Take 1 tablet by mouth daily. 90 capsule 4   No current facility-administered medications for this visit.      ALLERGIES: Amoxicillin; Cefaclor; and Sulfa antibiotics  Family History  Problem Relation Age of Onset  . Ovarian cancer Mother 69       BRCA positive  . BRCA 1/2 Mother        BRCA1  . Breast cancer Maternal Grandmother        d. 1  . BRCA 1/2 Sister        BRCA1 pos  . BRCA 1/2 Sister        BRCA1 pos  . Uterine cancer Other  MGMs sister    Social History   Socioeconomic History  . Marital status: Single    Spouse name: Not on file  . Number of children: Not on file  . Years of education: Not on file  . Highest education level: Not on file  Occupational History  . Not on file  Social Needs  . Financial resource strain: Not on file  . Food insecurity:    Worry: Not on file    Inability: Not on file  . Transportation needs:    Medical: Not on file    Non-medical: Not on file  Tobacco Use  . Smoking status: Never Smoker  . Smokeless tobacco: Never Used  Substance and Sexual Activity  . Alcohol use: Yes    Frequency: Never    Comment: social  . Drug use: Not Currently  . Sexual activity: Yes    Birth control/protection: Injection  Lifestyle  . Physical activity:    Days per week: Not on file    Minutes per session: Not on file  . Stress: Not on file  Relationships  . Social connections:    Talks on phone: Not on file    Gets together: Not on file    Attends religious service: Not on file    Active member of club  or organization: Not on file    Attends meetings of clubs or organizations: Not on file    Relationship status: Not on file  . Intimate partner violence:    Fear of current or ex partner: Not on file    Emotionally abused: Not on file    Physically abused: Not on file    Forced sexual activity: Not on file  Other Topics Concern  . Not on file  Social History Narrative  . Not on file    Review of Systems  All other systems reviewed and are negative.   PHYSICAL EXAMINATION:    BP 100/70 (BP Location: Right Arm, Patient Position: Bed low/side rails up, Cuff Size: Normal)   Pulse 84   Ht 5' 1"  (1.549 m)   Wt 137 lb 3.2 oz (62.2 kg)   LMP 09/16/2018 (Exact Date)   BMI 25.92 kg/m     General appearance: alert, cooperative and appears stated age.  ASSESSMENT  BRCA1 mutation.  Hx migraine without aura.  May start Topamax.  Hx positive HR HPV.  On COCs.  PLAN  Continue COCs.  Refills given. I discussed back up protection with condoms if sexually active.  We reviewed the potential interaction between Topamax and her pills.  Return for annual exam in Sept. 2020.  She will follow up with Dr. Jana Hakim at the [facility] as well.   An After Visit Summary was printed and given to the patient.  ___15___ minutes face to face time of which over 50% was spent in counseling.

## 2018-09-23 ENCOUNTER — Other Ambulatory Visit: Payer: Self-pay

## 2018-09-23 ENCOUNTER — Encounter: Payer: Self-pay | Admitting: Obstetrics and Gynecology

## 2018-09-23 ENCOUNTER — Ambulatory Visit: Payer: 59 | Admitting: Obstetrics and Gynecology

## 2018-09-23 VITALS — BP 100/70 | HR 84 | Ht 61.0 in | Wt 137.2 lb

## 2018-09-23 DIAGNOSIS — Z3041 Encounter for surveillance of contraceptive pills: Secondary | ICD-10-CM

## 2018-09-23 MED ORDER — LEVONORGEST-ETH ESTRAD 91-DAY 0.15-0.03 MG PO TABS: 1.0000 | ORAL_TABLET | Freq: Every day | ORAL | 1 refills | Status: DC

## 2018-10-13 ENCOUNTER — Inpatient Hospital Stay: Payer: 59 | Admitting: Oncology

## 2018-12-15 MED FILL — SETLAKIN 0.15 MG-0.03 MG TA: 0.15-0.03 | 91 days supply | Qty: 91 | Fill #0

## 2019-02-12 NOTE — Progress Notes (Signed)
[facility]  [phone] Fax:([phone]     ID: Rachel Stein DOB: 1995-12-04  MR#: [id number]  YFV#:[id number]  Patient Care Team: Flossie Buffy, NP as PCP - General (Internal Medicine) , Virgie Dad, MD as Consulting Physician (Oncology) Yisroel Ramming, Everardo All, MD as Consulting Physician (Obstetrics and Gynecology) OTHER MD:  CHIEF COMPLAINT: Breast cancer high risk, BRCA1 positive  CURRENT TREATMENT: Intensified screening, OCP   INTERVAL HISTORY: Rachel Stein returns today for follow up as a high risk patient.  Since her last visit, she underwent bilateral diagnostic mammography with tomography at [place] on 07/27/2018 showing: breast density category C; no evidence of malignancy in either breast.  She also had a baseline pelvic ultrasound under Dr. Quincy Simmonds in January of this year, but routine pelvic monitoring will start at age 89.  She is scheduled for bilateral breast MRI on 02/23/2019   REVIEW OF SYSTEMS: Rachel Stein reports work has been crazy, as she is a Marine scientist. She works three days a week (2 days in a row, then a break, then the third day) for 12 hours at a time. She splits them up to avoid exhaustion. She reports her exercise routine was disrupted with the virus closing the gyms, but she has been doing yoga. She sees Dr. Quincy Simmonds regularly. A detailed review of systems was otherwise entirely negative.    HISTORY OF CURRENT ILLNESS: Rachel Stein (pronounced "PON-sell") was referred to the high risk cancer clinic by Dr. Quincy Simmonds due to a family history of breast, uterine, and ovarian cancer and a deleterious BRCA1 mutation.   The patient underwent PAP ([id number]) on 04/01/2018 showing high risk HPV. The biopsy was negative for intraepithelial lesions or malignancy.   She underwent colposcopy ([id number]) on 05/04/2018, showing fragmented benign cervical transformation zone mucosa. There is no dysplasia or  malignancy identified in the endocervix, curettage. At 6 o'clock in the cervix and at 12 o'clock in the cervix, cervical transformation zone mucosa with mild inflammation and reactive epithelial changes were found. There is no dysplasia or malignancy identified. For the two cervix biopsies, the diagnoses were supported with Ki-67 and p16 immunohistochemistry and the findings correlate with the previous Pap test results ([id number]).  The patient was evaluated in our genetics clinic 05/11/2018 with the results 05/20/2018 showing a heterozygous variant identified in [place], associated with autosomal dominant hereditary breast and ovarian cancer syndrome. The deleterious mutation in BRCA1 is found at (Exons 1-2).   The patient's subsequent history is as detailed below.   PAST MEDICAL HISTORY: Past Medical History:  Diagnosis Date  . Asthma   . BRCA1 positive 2019  . Family history of BRCA gene mutation    patient's mother - BRCA1 positive  . Family history of breast cancer   . Family history of ovarian cancer   . Migraine    no apparent aura    PAST SURGICAL HISTORY: Past Surgical History:  Procedure Laterality Date  . ADENOIDECTOMY    . KNEE SURGERY Right 2012  . TONSILLECTOMY      FAMILY HISTORY: Family History  Problem Relation Age of Onset  . Ovarian cancer Mother 13       BRCA positive  . BRCA 1/2 Mother        BRCA1  . Breast cancer Maternal Grandmother        d. 68  . BRCA 1/2 Sister        BRCA1 pos  . BRCA 1/2 Sister  BRCA1 pos  . Uterine cancer Other        MGMs sister  (updated 06/2018) Rachel Stein's father is 76. Patients' mother is 30, was diagnosed with ovarian cancer at age 25 and is BRCA 1 positive.  She has a half brother about whom there is no information.  The patient has 2 sisters who are also both BRCA 1 positive.  They are in their late 71s with no history of breast cancer.  Rachel Stein's maternal grandmother had breast cancer diagnosed at age 48.  Rachel Stein's maternal great aunt had both uterine and breast cancers, age at diagnosis unknown (this is 44 of the patient's maternal grandmother's 3 sisters).  On the paternal side there is no cancer recorded.  The patient's father has 2 brothers and a sister and a half sister.   GYNECOLOGIC HISTORY:  No LMP recorded. 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 She works about three days a week. She is single and lives alone. She has a cat named [name].    ADVANCED DIRECTIVES: To be discussed   HEALTH MAINTENANCE: Social History   Tobacco Use  . Smoking status: Never Smoker  . Smokeless tobacco: Never Used  Substance Use Topics  . Alcohol use: Yes    Frequency: Never    Comment: social  . Drug use: Not Currently     Colonoscopy: no  PAP: up to date/[place]  Bone density: no   Allergies  Allergen Reactions  . Amoxicillin Rash and Swelling  . Cefaclor Rash and Swelling  . Sulfa Antibiotics Rash and Swelling    Current Outpatient Medications  Medication Sig Dispense Refill  . Aspirin-Acetaminophen-Caffeine (MIGRAINE RELIEF PO) Take by mouth as needed.    Marland Kitchen levonorgestrel-ethinyl estradiol (SEASONALE,INTROVALE,JOLESSA) 0.15-0.03 MG tablet Take 1 tablet by mouth daily. 1 Package 1  . Topiramate ER (TROKENDI XR) 50 MG CP24 Take 1 tablet by mouth daily. 90 capsule 4   No current facility-administered medications for this visit.      OBJECTIVE: Young white woman in no acute distress  Vitals:   02/13/19 0942  BP: (!) 123/57  Pulse: 88  Resp: 20  Temp: 99.1 F (37.3 C)  SpO2: 100%     Body mass index is 27.38 kg/m.   Wt Readings from Last 3 Encounters:  02/13/19 144 lb 14.4 oz (65.7 kg)  09/23/18 137  lb 3.2 oz (62.2 kg)  09/16/18 138 lb 6.4 oz (62.8 kg)      ECOG FS:0 - Asymptomatic  Sclerae unicteric, EOMs intact Wearing a mask No cervical or supraclavicular adenopathy Lungs no rales or rhonchi Heart regular rate and rhythm Abd soft, nontender, positive bowel sounds MSK no focal spinal tenderness, no upper extremity lymphedema Neuro: nonfocal, well oriented, appropriate affect Breasts: I do not palpate a mass in either breast.  There are no skin or nipple changes of concern.  Both axillae are benign.  Breasts: No masses palpated, no skin or nipple changes of concern.  Both axillae are benign   LAB RESULTS:  CMP     Component Value Date/Time   NA 140 04/01/2018 1135   K 4.6 04/01/2018 1135   CL 102 04/01/2018 1135   CO2 26 04/01/2018 1135   GLUCOSE 77 04/01/2018 1135   BUN 12 04/01/2018  1135   CREATININE 0.81 04/01/2018 1135   CALCIUM 10.1 04/01/2018 1135   PROT 7.1 04/01/2018 1135   ALBUMIN 5.0 04/01/2018 1135   AST 19 04/01/2018 1135   ALT 12 04/01/2018 1135   ALKPHOS 41 04/01/2018 1135   BILITOT 0.4 04/01/2018 1135   GFRNONAA 103 04/01/2018 1135   GFRAA 118 04/01/2018 1135    No results found for: TOTALPROTELP, ALBUMINELP, A1GS, A2GS, BETS, BETA2SER, GAMS, MSPIKE, SPEI  No results found for: KPAFRELGTCHN, LAMBDASER, KAPLAMBRATIO  Lab Results  Component Value Date   WBC 7.3 04/01/2018   HGB 13.1 04/01/2018   HCT 38.7 04/01/2018   MCV 89 04/01/2018   PLT 299 04/01/2018    _0 @  No results found for: LABCA2  No components found for: [id number]  No results for input(s): INR in the last 168 hours.  No results found for: LABCA2  No results found for: ONG295  No results found for: MWU132  No results found for: GMW102  No results found for: CA2729  No components found for: HGQUANT  No results found for: CEA1 / No results found for: CEA1   No results found for: AFPTUMOR  No results found for: CHROMOGRNA  No results found for:  PSA1  No visits with results within 3 Day(s) from this visit.  Latest known visit with results is:  Office Visit on 05/04/2018  Component Date Value Ref Range Status  . Preg Test, Ur 05/04/2018 Negative  Negative Final    (this displays the last labs from the last 3 days)  No results found for: TOTALPROTELP, ALBUMINELP, A1GS, A2GS, BETS, BETA2SER, GAMS, MSPIKE, SPEI (this displays SPEP labs)  No results found for: KPAFRELGTCHN, LAMBDASER, KAPLAMBRATIO (kappa/lambda light chains)  No results found for: HGBA, HGBA2QUANT, HGBFQUANT, HGBSQUAN (Hemoglobinopathy evaluation)   No results found for: LDH  No results found for: IRON, TIBC, IRONPCTSAT (Iron and TIBC)  No results found for: FERRITIN  Urinalysis No results found for: COLORURINE, APPEARANCEUR, LABSPEC, PHURINE, GLUCOSEU, HGBUR, BILIRUBINUR, KETONESUR, PROTEINUR, UROBILINOGEN, NITRITE, LEUKOCYTESUR   STUDIES:  No results found.   ELIGIBLE FOR AVAILABLE RESEARCH PROTOCOL: no   ASSESSMENT: 23 y.o. [place], [place] woman with a deleterious BRCA1 mutation (deletion exons 1-2)  (1) no additional deleterious mutations were noted through the Common Hereditary Cancer Panel offered by [facility] in APC, ATM, AXIN2, BARD1, BMPR1A, BRCA2, BRIP1, CDH1, CDK4, CDKN2A, CHEK2, CTNNA1, DICER1, EPCAM, GREM1, HOXB13, KIT, MEN1, MLH1, MSH2, MSH3, MSH6, MUTYH, NBN, NF1, NTHL1, PALB2, PDGFRA, PMS2, POLD1, POLE, PTEN, RAD50, RAD51C, RAD51D, SDHA, SDHB, SDHC, SDHD, SMAD4, SMARCA4. STK11, TP53, TSC1, TSC2, and VHL .  (2) ovarian cancer high risk:  (a) continue combined progesterone/estrogen contraceptives, which reduce risk by approximately one half  (b) consider bilateral salpingo-oophorectomy (likely also with hysterectomy given family history) around age 31 if no further childbearing planned  (c) biannual TVUS starting at age 2    (42) pancreatic cancer increased risk:  (a) consider yearly MRCP if family history of pancreatic cancer develops   (4) breast cancer high risk:  (a) intensified screening with yearly breast MRI, yearly mammography, and biannual MD breast exam   (b) consider bilateral mastectomies if and when breast cancer is detected  (c) optimize diet and exercise: discussed   PLAN: Rachel Stein has a good understanding of her screening protocol.  We reviewed her mammography results which show a breast density C.  She will have her MRI next month.  She has a good understanding of the possibility of false positives and of course  there is the possibility of cost concerns.  She continues on oral contraceptives, which cycle every 3 months.  She will see Dr. Quincy Simmonds in September.  She will have her next breast mammogram in January or February of next year and she is already scheduled to see me March of next year  She is taking appropriate pandemic precautions.  She knows to call for any other issue that may develop before her next visit.     , Virgie Dad, MD  02/13/19 10:05 AM Medical Oncology and Hematology [facility] [address] [place], [place] [address] Tel. [phone]    Fax. [phone]    I, Wilburn Mylar, am acting as scribe for Dr. Virgie Dad. .  I, Lurline Del MD, have reviewed the above documentation for accuracy and completeness, and I agree with the above.

## 2019-02-13 ENCOUNTER — Inpatient Hospital Stay: Payer: 59 | Attending: Oncology | Admitting: Oncology

## 2019-02-13 ENCOUNTER — Other Ambulatory Visit: Payer: Self-pay

## 2019-02-13 VITALS — BP 123/57 | HR 88 | Temp 99.1°F | Resp 20 | Ht 61.0 in | Wt 144.9 lb

## 2019-02-13 DIAGNOSIS — Z1509 Genetic susceptibility to other malignant neoplasm: Secondary | ICD-10-CM

## 2019-02-13 DIAGNOSIS — Z79899 Other long term (current) drug therapy: Secondary | ICD-10-CM | POA: Diagnosis not present

## 2019-02-13 DIAGNOSIS — Z9189 Other specified personal risk factors, not elsewhere classified: Secondary | ICD-10-CM

## 2019-02-13 DIAGNOSIS — Z8041 Family history of malignant neoplasm of ovary: Secondary | ICD-10-CM | POA: Insufficient documentation

## 2019-02-13 DIAGNOSIS — Z1501 Genetic susceptibility to malignant neoplasm of breast: Secondary | ICD-10-CM | POA: Diagnosis not present

## 2019-02-13 DIAGNOSIS — R87612 Low grade squamous intraepithelial lesion on cytologic smear of cervix (LGSIL): Secondary | ICD-10-CM

## 2019-02-13 DIAGNOSIS — Z803 Family history of malignant neoplasm of breast: Secondary | ICD-10-CM | POA: Diagnosis not present

## 2019-02-13 DIAGNOSIS — R87810 Cervical high risk human papillomavirus (HPV) DNA test positive: Secondary | ICD-10-CM | POA: Diagnosis not present

## 2019-02-13 DIAGNOSIS — J45909 Unspecified asthma, uncomplicated: Secondary | ICD-10-CM | POA: Diagnosis not present

## 2019-02-18 HISTORY — PX: BREAST BIOPSY: SHX20

## 2019-02-18 HISTORY — PX: BREAST SURGERY: SHX581

## 2019-02-23 ENCOUNTER — Other Ambulatory Visit: Payer: Self-pay

## 2019-02-23 ENCOUNTER — Ambulatory Visit
Admission: RE | Admit: 2019-02-23 | Discharge: 2019-02-23 | Disposition: A | Discharge status: Home / Self Care | Payer: 59 | Source: Ambulatory Visit | Attending: Oncology | Admitting: Oncology

## 2019-02-23 ENCOUNTER — Other Ambulatory Visit: Payer: Self-pay | Admitting: Oncology

## 2019-02-23 DIAGNOSIS — Z1509 Genetic susceptibility to other malignant neoplasm: Secondary | ICD-10-CM

## 2019-02-23 DIAGNOSIS — Z1501 Genetic susceptibility to malignant neoplasm of breast: Secondary | ICD-10-CM

## 2019-02-23 DIAGNOSIS — N6310 Unspecified lump in the right breast, unspecified quadrant: Secondary | ICD-10-CM

## 2019-02-23 DIAGNOSIS — N6313 Unspecified lump in the right breast, lower outer quadrant: Secondary | ICD-10-CM | POA: Diagnosis not present

## 2019-02-23 DIAGNOSIS — Z1231 Encounter for screening mammogram for malignant neoplasm of breast: Secondary | ICD-10-CM

## 2019-02-23 MED ORDER — GADOBUTROL 1 MMOL/ML IV SOLN
6.0000 mL | Freq: Once | INTRAVENOUS | Status: AC | PRN
  Administered 2019-02-23: 6 mL via INTRAVENOUS

## 2019-02-23 NOTE — Progress Notes (Signed)
I called Rachel Stein with the results of her study and set her up for an MRI biopsy, which she agrees to.

## 2019-03-03 ENCOUNTER — Other Ambulatory Visit: Payer: Self-pay

## 2019-03-03 ENCOUNTER — Ambulatory Visit
Admission: RE | Admit: 2019-03-03 | Discharge: 2019-03-03 | Disposition: A | Discharge status: Home / Self Care | Payer: 59 | Source: Ambulatory Visit | Attending: Oncology | Admitting: Oncology

## 2019-03-03 DIAGNOSIS — Z1501 Genetic susceptibility to malignant neoplasm of breast: Secondary | ICD-10-CM

## 2019-03-03 DIAGNOSIS — N6310 Unspecified lump in the right breast, unspecified quadrant: Secondary | ICD-10-CM

## 2019-03-03 DIAGNOSIS — N6311 Unspecified lump in the right breast, upper outer quadrant: Secondary | ICD-10-CM | POA: Diagnosis not present

## 2019-03-03 DIAGNOSIS — D241 Benign neoplasm of right breast: Secondary | ICD-10-CM | POA: Diagnosis not present

## 2019-03-03 MED ORDER — GADOBUTROL 1 MMOL/ML IV SOLN
6.0000 mL | Freq: Once | INTRAVENOUS | Status: AC | PRN
  Administered 2019-03-03: 6 mL via INTRAVENOUS

## 2019-03-15 MED FILL — SETLAKIN 0.15 MG-0.03 MG TA: 0.15-0.03 | 91 days supply | Qty: 91 | Fill #1

## 2019-03-31 ENCOUNTER — Other Ambulatory Visit: Payer: Self-pay

## 2019-04-03 ENCOUNTER — Encounter: Payer: Self-pay | Admitting: Obstetrics and Gynecology

## 2019-04-03 ENCOUNTER — Other Ambulatory Visit (HOSPITAL_COMMUNITY)
Admission: RE | Admit: 2019-04-03 | Discharge: 2019-04-03 | Disposition: A | Discharge status: Home / Self Care | Payer: 59 | Source: Ambulatory Visit | Attending: Obstetrics and Gynecology | Admitting: Obstetrics and Gynecology

## 2019-04-03 ENCOUNTER — Ambulatory Visit (INDEPENDENT_AMBULATORY_CARE_PROVIDER_SITE_OTHER): Payer: 59 | Admitting: Obstetrics and Gynecology

## 2019-04-03 ENCOUNTER — Other Ambulatory Visit: Payer: Self-pay

## 2019-04-03 VITALS — BP 100/60 | HR 70 | Temp 97.5°F | Resp 14 | Ht 61.25 in | Wt 143.6 lb

## 2019-04-03 DIAGNOSIS — Z113 Encounter for screening for infections with a predominantly sexual mode of transmission: Secondary | ICD-10-CM | POA: Diagnosis not present

## 2019-04-03 DIAGNOSIS — Z01419 Encounter for gynecological examination (general) (routine) without abnormal findings: Secondary | ICD-10-CM | POA: Insufficient documentation

## 2019-04-03 MED ORDER — LEVONORGEST-ETH ESTRAD 91-DAY 0.15-0.03 MG PO TABS: 1.0000 | ORAL_TABLET | Freq: Every day | ORAL | 3 refills | Status: AC

## 2019-04-03 NOTE — Progress Notes (Signed)
23 y.o. G0P0000 Single Caucasian female here for annual exam.    Taking COCs and doing well with them.  No period problems.   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   PCP: Wilfred Lacy, NP   Patient's last menstrual period was 03/15/2019 (exact date).           Sexually active: No.  The current method of family planning is OCP (estrogen/progesterone).    Exercising: Yes.    high intensity/weights Smoker:  no  Health Maintenance: Pap: 04-01-18 Neg:Pos HR HPV History of abnormal Pap:  Yes,  04-01-18 Neg:Pos HR HPV;05-04-18 colposcopy neg MMG: BRCA 1 gene mutation;02-23-19 MRI Bil--Irregular mass Rt.Br.suspicious, Lt.Br.neg;Rt.Br.Bx path revealed tubular adenoma upper outer Rt.Br./rec.bil.breast MRI 6 months Colonoscopy:  n/a BMD:   n/a  Result  n/a TDaP:  2017 Gardasil:   yes HIV:no Hep C:no Screening Labs:  today   reports that she has never smoked. She has never used smokeless tobacco. She reports current alcohol use of about 1.0 standard drinks of alcohol per week. She reports previous drug use.  Past Medical History:  Diagnosis Date  . Asthma   . BRCA1 positive 2019  . Family history of BRCA gene mutation    patient's mother - BRCA1 positive  . Family history of breast cancer   . Family history of ovarian cancer   . Migraine    no apparent aura    Past Surgical History:  Procedure Laterality Date  . ADENOIDECTOMY    . BREAST SURGERY  02/2019   Rt.Breast Bx--tubular adenoma  . KNEE SURGERY Right 2012  . TONSILLECTOMY      Current Outpatient Medications  Medication Sig Dispense Refill  . Aspirin-Acetaminophen-Caffeine (MIGRAINE RELIEF PO) Take by mouth as needed.    Marland Kitchen levonorgestrel-ethinyl estradiol (SEASONALE,INTROVALE,JOLESSA) 0.15-0.03 MG tablet Take 1 tablet by mouth daily. 1 Package 1   No current facility-administered medications for this visit.     Family History  Problem Relation Age of Onset  . Ovarian cancer Mother 34       BRCA  positive  . BRCA 1/2 Mother        BRCA1  . Breast cancer Maternal Grandmother        d. 48  . BRCA 1/2 Sister        BRCA1 pos  . BRCA 1/2 Sister        BRCA1 pos  . Uterine cancer Other        MGMs sister    Review of Systems  All other systems reviewed and are negative.   Exam:   BP 100/60   Pulse 70   Temp (!) 97.5 F (36.4 C) (Temporal)   Resp 14   Ht 5' 1.25" (1.556 m)   Wt 143 lb 9.6 oz (65.1 kg)   LMP 03/15/2019 (Exact Date)   BMI 26.91 kg/m     General appearance: alert, cooperative and appears stated age Head: normocephalic, without obvious abnormality, atraumatic Neck: no adenopathy, supple, symmetrical, trachea midline and thyroid normal to inspection and palpation Lungs: clear to auscultation bilaterally Breasts: normal appearance, no masses or tenderness, No nipple retraction or dimpling, No nipple discharge or bleeding, No axillary adenopathy Heart: regular rate and rhythm Abdomen: soft, non-tender; no masses, no organomegaly Extremities: extremities normal, atraumatic, no cyanosis or edema Skin: skin color, texture, turgor normal. No rashes or lesions Lymph nodes: cervical, supraclavicular, and axillary nodes normal. Neurologic: grossly normal  Pelvic: External genitalia:  no lesions  No abnormal inguinal nodes palpated.              Urethra:  normal appearing urethra with no masses, tenderness or lesions              Bartholins and Skenes: normal                 Vagina: normal appearing vagina with normal color and discharge, no lesions              Cervix: no lesions.  Bleeds with pap.               Pap taken: Yes.   Bimanual Exam:  Uterus:  normal size, contour, position, consistency, mobility, non-tender              Adnexa: no mass, fullness, tenderness           Chaperone was present for exam.  Assessment:   Well woman visit with normal exam. BRCA1 mutation.  Status post benign breast biopsy in August 2020. Hx migraine without  aura.   Plan:  Self breast awareness reviewed. Pap and HR HPV as above. Guidelines for Calcium, Vitamin D, regular exercise program including cardiovascular and weight bearing exercise. STD screening and routine labs today. She will have a breast MRI in Jan. 2021.  Next pelvic US at age 33 or if has pain or abnormal bleeding.  Flu vaccine recommended.  Follow up annually and prn.    After visit summary provided.

## 2019-04-03 NOTE — Patient Instructions (Signed)

## 2019-04-04 LAB — LIPID PANEL
Chol/HDL Ratio: 3.2 ratio (ref 0.0–4.4)
Cholesterol, Total: 208 mg/dL — ABNORMAL HIGH (ref 100–199)
HDL: 65 mg/dL (ref >39)
LDL Chol Calc (NIH): 129 mg/dL — ABNORMAL HIGH (ref 0–99)
Triglycerides: 76 mg/dL (ref 0–149)
VLDL Cholesterol Cal: 14 mg/dL (ref 5–40)

## 2019-04-04 LAB — CBC
Hematocrit: 40.8 % (ref 34.0–46.6)
Hemoglobin: 13.8 g/dL (ref 11.1–15.9)
MCH: 29.6 pg (ref 26.6–33.0)
MCHC: 33.8 g/dL (ref 31.5–35.7)
MCV: 87 fL (ref 79–97)
Platelets: 334 10*3/uL (ref 150–450)
RBC: 4.67 x10E6/uL (ref 3.77–5.28)
RDW: 12.2 % (ref 11.7–15.4)
WBC: 7.6 10*3/uL (ref 3.4–10.8)

## 2019-04-04 LAB — COMPREHENSIVE METABOLIC PANEL
ALT: 22 IU/L (ref 0–32)
AST: 21 IU/L (ref 0–40)
Albumin/Globulin Ratio: 2.2 (ref 1.2–2.2)
Albumin: 4.8 g/dL (ref 3.9–5.0)
Alkaline Phosphatase: 38 IU/L — ABNORMAL LOW (ref 39–117)
BUN/Creatinine Ratio: 13 (ref 9–23)
BUN: 10 mg/dL (ref 6–20)
Bilirubin Total: 0.4 mg/dL (ref 0.0–1.2)
CO2: 23 mmol/L (ref 20–29)
Calcium: 9.5 mg/dL (ref 8.7–10.2)
Chloride: 101 mmol/L (ref 96–106)
Creatinine, Ser: 0.79 mg/dL (ref 0.57–1.00)
GFR calc Af Amer: 121 mL/min/{1.73_m2} (ref >59)
GFR calc non Af Amer: 105 mL/min/{1.73_m2} (ref >59)
Globulin, Total: 2.2 g/dL (ref 1.5–4.5)
Glucose: 105 mg/dL — ABNORMAL HIGH (ref 65–99)
Potassium: 4 mmol/L (ref 3.5–5.2)
Sodium: 138 mmol/L (ref 134–144)
Total Protein: 7 g/dL (ref 6.0–8.5)

## 2019-04-04 LAB — HEP, RPR, HIV PANEL
HIV Screen 4th Generation wRfx: NONREACTIVE
Hepatitis B Surface Ag: NEGATIVE
RPR Ser Ql: NONREACTIVE

## 2019-04-04 LAB — HEPATITIS C ANTIBODY: Hep C Virus Ab: <0.1 s/co ratio (ref 0.0–0.9)

## 2019-04-05 LAB — CYTOLOGY - PAP
Chlamydia: NEGATIVE
Diagnosis: NEGATIVE
Neisseria Gonorrhea: NEGATIVE
Trichomonas: NEGATIVE

## 2019-05-23 ENCOUNTER — Other Ambulatory Visit: Payer: Self-pay | Admitting: Oncology

## 2019-05-23 DIAGNOSIS — D369 Benign neoplasm, unspecified site: Secondary | ICD-10-CM

## 2019-07-31 ENCOUNTER — Other Ambulatory Visit: Payer: Self-pay

## 2019-07-31 ENCOUNTER — Ambulatory Visit
Admission: RE | Admit: 2019-07-31 | Discharge: 2019-07-31 | Disposition: A | Discharge status: Home / Self Care | Payer: 59 | Source: Ambulatory Visit | Attending: Oncology | Admitting: Oncology

## 2019-07-31 DIAGNOSIS — Z1501 Genetic susceptibility to malignant neoplasm of breast: Secondary | ICD-10-CM

## 2019-07-31 DIAGNOSIS — Z1509 Genetic susceptibility to other malignant neoplasm: Secondary | ICD-10-CM

## 2019-07-31 DIAGNOSIS — Z1231 Encounter for screening mammogram for malignant neoplasm of breast: Secondary | ICD-10-CM | POA: Diagnosis not present

## 2019-07-31 DIAGNOSIS — R87612 Low grade squamous intraepithelial lesion on cytologic smear of cervix (LGSIL): Secondary | ICD-10-CM

## 2019-07-31 DIAGNOSIS — Z9189 Other specified personal risk factors, not elsewhere classified: Secondary | ICD-10-CM

## 2019-08-01 ENCOUNTER — Other Ambulatory Visit: Payer: Self-pay | Admitting: Oncology

## 2019-08-01 DIAGNOSIS — R928 Other abnormal and inconclusive findings on diagnostic imaging of breast: Secondary | ICD-10-CM

## 2019-08-03 ENCOUNTER — Ambulatory Visit
Admission: RE | Admit: 2019-08-03 | Discharge: 2019-08-03 | Disposition: A | Discharge status: Home / Self Care | Payer: 59 | Source: Ambulatory Visit | Attending: Oncology | Admitting: Oncology

## 2019-08-03 ENCOUNTER — Other Ambulatory Visit: Payer: Self-pay | Admitting: Oncology

## 2019-08-03 ENCOUNTER — Other Ambulatory Visit: Payer: Self-pay

## 2019-08-03 DIAGNOSIS — R590 Localized enlarged lymph nodes: Secondary | ICD-10-CM

## 2019-08-03 DIAGNOSIS — R922 Inconclusive mammogram: Secondary | ICD-10-CM | POA: Diagnosis not present

## 2019-08-03 DIAGNOSIS — R928 Other abnormal and inconclusive findings on diagnostic imaging of breast: Secondary | ICD-10-CM

## 2019-08-03 DIAGNOSIS — N6489 Other specified disorders of breast: Secondary | ICD-10-CM | POA: Diagnosis not present

## 2019-08-29 ENCOUNTER — Other Ambulatory Visit: Payer: Self-pay | Admitting: Oncology

## 2019-08-29 DIAGNOSIS — D369 Benign neoplasm, unspecified site: Secondary | ICD-10-CM

## 2019-08-30 ENCOUNTER — Other Ambulatory Visit: Payer: Self-pay

## 2019-08-30 ENCOUNTER — Ambulatory Visit
Admission: RE | Admit: 2019-08-30 | Discharge: 2019-08-30 | Disposition: A | Discharge status: Home / Self Care | Payer: 59 | Source: Ambulatory Visit | Attending: Oncology | Admitting: Oncology

## 2019-08-30 DIAGNOSIS — D241 Benign neoplasm of right breast: Secondary | ICD-10-CM | POA: Diagnosis not present

## 2019-08-30 MED ORDER — GADOBUTROL 1 MMOL/ML IV SOLN
6.0000 mL | Freq: Once | INTRAVENOUS | Status: AC | PRN
  Administered 2019-08-30: 11:00:00 6 mL via INTRAVENOUS

## 2019-08-31 ENCOUNTER — Encounter: Payer: Self-pay | Admitting: Oncology

## 2019-09-13 MED FILL — SETLAKIN 0.15 MG-0.03 MG TA: 0.15-0.03 | 91 days supply | Qty: 91 | Fill #1

## 2019-10-04 NOTE — Progress Notes (Signed)
Rachel Stein  [phone] [phone]     ID: Rachel Stein DOB: Feb 06, 1996  MR#: [id number]  GUY#:[id number]  Patient Care Team: Flossie Buffy, NP as PCP - General (Internal Medicine) Elizabet Schweppe, Virgie Dad, MD as Consulting Physician (Oncology) Yisroel Ramming, Everardo All, MD as Consulting Physician (Obstetrics and Gynecology) OTHER MD:  CHIEF COMPLAINT: Breast cancer high risk, BRCA1 positive  CURRENT TREATMENT: Intensified screening, OCP   INTERVAL HISTORY: Rachel Stein returns today for follow up as a high risk patient.  Since her last visit, she underwent breast MRI on 02/23/2019, which showed: breast composition C; 6 mm enhancing mass at 8-9 o'clock in the right breast; no evidence of malignancy within the left breast or lymph nodes.  She proceeded to biopsy of the right breast area in question on 03/03/2019. Pathology [id number]) showed adenoma, negative for carcinoma.  She had screening mammography on 07/31/2019 showing a possible abnormality in the left breast and left axilla. She underwent left diagnostic mammography with tomography and left breast ultrasonography at [facility] on 08/03/2019 showing: breast density category C; at least two enlarged left axillary lymph nodes, these are felt to be likely reactive given recent Covid-19 vaccine; no persistent mammographic or sonographic abnormality within the left breast.  A repeat left breast ultrasound was recommended in 3 months. This is scheduled for 11/02/2019.  She also underwent breast MRI on 08/30/2019 showing: breast composition C; no evidence of breast malignancy; mildly prominent left axillary lymph nodes, appear decreased in size since 08/03/2019 ultrasound and likely related to recent vaccine.  Of note, she also saw Dr. Quincy Simmonds on 04/03/2019 for routine exam and pap smear, which was negative.   REVIEW OF SYSTEMS: Avo has run through her first round of intensified screening  which is usually where the false positives show up.  She did have to undergo 1 biopsy.  Fortunately that was benign.  The main problem she is having now is poorly controlled migraines.  This was being followed by neurology in [place] but she has had no follow-up for that here.  She tells me the medications that she was taking before which were effective are now $400 a month and more than she can afford.    HISTORY OF CURRENT ILLNESS: From the original intake note:  Rachel Nicole Mayberry (pronounced "PON-sell") was referred to the high risk cancer clinic by Dr. Quincy Simmonds due to a family history of breast, uterine, and ovarian cancer and a deleterious BRCA1 mutation.   The patient underwent PAP ([id number]) on 04/01/2018 showing high risk HPV. The biopsy was negative for intraepithelial lesions or malignancy.   She underwent colposcopy ([id number]) on 05/04/2018, showing fragmented benign cervical transformation zone mucosa. There is no dysplasia or malignancy identified in the endocervix, curettage. At 6 o'clock in the cervix and at 12 o'clock in the cervix, cervical transformation zone mucosa with mild inflammation and reactive epithelial changes were found. There is no dysplasia or malignancy identified. For the two cervix biopsies, the diagnoses were supported with Ki-67 and p16 immunohistochemistry and the findings correlate with the previous Pap test results ([id number]).  The patient was evaluated in our genetics clinic 05/11/2018 with the results 05/20/2018 showing a heterozygous variant identified in Rachel [place], associated with autosomal dominant hereditary breast and ovarian cancer syndrome. The deleterious mutation in BRCA1 is found at (Exons 1-2).   The patient's subsequent history is as detailed below.   PAST MEDICAL HISTORY: Past Medical History:  Diagnosis Date  . Asthma   .  BRCA1 positive 2019  . Family history of BRCA gene mutation    patient's mother - BRCA1 positive  . Family  history of breast cancer   . Family history of ovarian cancer   . Migraine    no apparent aura    PAST SURGICAL HISTORY: Past Surgical History:  Procedure Laterality Date  . ADENOIDECTOMY    . BREAST BIOPSY Right 02/2019  . BREAST SURGERY  02/2019   Rt.Breast Bx--tubular adenoma  . KNEE SURGERY Right 2012  . TONSILLECTOMY      FAMILY HISTORY: Family History  Problem Relation Age of Onset  . Ovarian cancer Mother 31       BRCA positive  . BRCA 1/2 Mother 29       BRCA1  . Breast cancer Maternal Grandmother        d. 34  . BRCA 1/2 Sister 56       BRCA1 pos  . BRCA 1/2 Sister 78       BRCA1 pos  . Uterine cancer Other        MGMs sister  (updated 06/2018) Rachel Stein's father is 37. Patients' mother is 70, was diagnosed with ovarian cancer at age 54 and is BRCA 1 positive.  She has a half brother about whom there is no information.  The patient has 2 sisters who are also both BRCA 1 positive.  They are in their late 7s with no history of breast cancer.  Rachel Stein's maternal grandmother had breast cancer diagnosed at age 79. Rachel Stein's maternal great aunt had both uterine and breast cancers, age at diagnosis unknown (this is 29 of the patient's maternal grandmother's 3 sisters).  On the paternal side there is no cancer recorded.  The patient's father has 2 brothers and a sister and a half sister.   GYNECOLOGIC HISTORY:  No LMP recorded. (Menstrual status: Oral contraceptives). 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 She works three days a week, 7-7, currently for shift. She is single and lives alone. She has a cat named [name].    ADVANCED DIRECTIVES: To be discussed   HEALTH MAINTENANCE: Social History     Tobacco Use  . Smoking status: Never Smoker  . Smokeless tobacco: Never Used  Substance Use Topics  . Alcohol use: Yes    Alcohol/week: 1.0 standard drinks    Types: 1 Glasses of wine per week    Comment: social  . Drug use: Not Currently     Colonoscopy: no  PAP: 04/03/2019, negative  Bone density: no   Allergies  Allergen Reactions  . Amoxicillin Rash and Swelling  . Cefaclor Rash and Swelling  . Sulfa Antibiotics Rash and Swelling    Current Outpatient Medications  Medication Sig Dispense Refill  . Aspirin-Acetaminophen-Caffeine (MIGRAINE RELIEF PO) Take by mouth as needed.    Marland Kitchen levonorgestrel-ethinyl estradiol (SEASONALE) 0.15-0.03 MG tablet Take 1 tablet by mouth daily. 1 Package 3  . SUMAtriptan (IMITREX) 50 MG tablet Take 1 tablet (50 mg total) by mouth every 2 (two) hours as needed for migraine. May repeat in 2 hours if headache persists or recurs. 10 tablet 3  . topiramate (TOPAMAX) 50 MG tablet Take 0.5 tablets (25 mg total) by mouth daily. 30 tablet 4   No current facility-administered medications  for this visit.     OBJECTIVE: Rachel Stein who appears stated age  77:   10/05/19 1421  BP: 118/68  Pulse: 79  Resp: 18  Temp: 98 F (36.7 C)  SpO2: 98%     Body mass index is 28.46 kg/m.   Wt Readings from Last 3 Encounters:  10/05/19 145 lb 11.2 oz (66.1 kg)  04/03/19 143 lb 9.6 oz (65.1 kg)  02/13/19 144 lb 14.4 oz (65.7 kg)      ECOG FS:1 - Symptomatic but completely ambulatory  Sclerae unicteric, EOMs intact, pupils round and equal Wearing a mask No cervical or supraclavicular adenopathy Lungs no rales or rhonchi Heart regular rate and rhythm Abd soft, nontender, positive bowel sounds MSK no focal spinal tenderness Neuro: nonfocal, well oriented, appropriate affect Breasts: No masses palpated, no skin or nipple changes; both axillae are benign   LAB RESULTS:  CMP     Component Value Date/Time   NA 138 04/03/2019 1345   K 4.0  04/03/2019 1345   CL 101 04/03/2019 1345   CO2 23 04/03/2019 1345   GLUCOSE 105 (H) 04/03/2019 1345   BUN 10 04/03/2019 1345   CREATININE 0.79 04/03/2019 1345   CALCIUM 9.5 04/03/2019 1345   PROT 7.0 04/03/2019 1345   ALBUMIN 4.8 04/03/2019 1345   AST 21 04/03/2019 1345   ALT 22 04/03/2019 1345   ALKPHOS 38 (L) 04/03/2019 1345   BILITOT 0.4 04/03/2019 1345   GFRNONAA 105 04/03/2019 1345   GFRAA 121 04/03/2019 1345    No results found for: TOTALPROTELP, ALBUMINELP, A1GS, A2GS, BETS, BETA2SER, GAMS, MSPIKE, SPEI  No results found for: KPAFRELGTCHN, LAMBDASER, KAPLAMBRATIO  Lab Results  Component Value Date   WBC 7.6 04/03/2019   HGB 13.8 04/03/2019   HCT 40.8 04/03/2019   MCV 87 04/03/2019   PLT 334 04/03/2019   No results found for: LABCA2  No components found for: HRCBUL845  No results for input(s): INR in the last 168 hours.  No results found for: LABCA2  No results found for: XMI680  No results found for: HOZ224  No results found for: MGN003  No results found for: CA2729  No components found for: HGQUANT  No results found for: CEA1 / No results found for: CEA1   No results found for: AFPTUMOR  No results found for: CHROMOGRNA  No results found for: HGBA, HGBA2QUANT, HGBFQUANT, HGBSQUAN (Hemoglobinopathy evaluation)   No results found for: LDH  No results found for: IRON, TIBC, IRONPCTSAT (Iron and TIBC)  No results found for: FERRITIN  Urinalysis No results found for: COLORURINE, APPEARANCEUR, LABSPEC, PHURINE, GLUCOSEU, HGBUR, BILIRUBINUR, KETONESUR, PROTEINUR, UROBILINOGEN, NITRITE, LEUKOCYTESUR   STUDIES:  No results found.   ELIGIBLE FOR AVAILABLE RESEARCH PROTOCOL: no   ASSESSMENT: 24 y.o. [place], [place] Stein with a deleterious BRCA1 mutation (deletion exons 1-2)  (1) no additional deleterious mutations were noted through the Common Hereditary Cancer Panel offered by [facility] in APC, ATM, AXIN2, BARD1, BMPR1A, BRCA2, BRIP1, CDH1,  CDK4, CDKN2A, CHEK2, CTNNA1, DICER1, EPCAM, GREM1, HOXB13, KIT, MEN1, MLH1, MSH2, MSH3, MSH6, MUTYH, NBN, NF1, NTHL1, PALB2, PDGFRA, PMS2, POLD1, POLE, PTEN, RAD50, RAD51C, RAD51D, SDHA, SDHB, SDHC, SDHD, SMAD4, SMARCA4. STK11, TP53, TSC1, TSC2, and VHL .  (2) ovarian cancer high risk:  (a) continue combined progesterone/estrogen contraceptives, which reduce risk by approximately one half  (b) consider bilateral salpingo-oophorectomy (likely also with hysterectomy given family history) around age 8 if no further childbearing planned  (c) biannual TVUS starting at age  28    (3) pancreatic cancer increased risk:  (a) consider yearly MRCP if family history of pancreatic cancer develops  (4) breast cancer high risk:  (a) intensified screening with yearly breast MRI, yearly mammography, and biannual MD breast exam   (b) consider bilateral mastectomies if and when breast cancer is detected  (c) optimize diet and exercise: discussed  (d) right breast upper outer quadrant biopsy 03/03/2019 showed a tubular adenoma, no malignancy  (5) migraines  (a) sumatriptan and topiramate started 10/05/2019  (b) neurology referral placed 10/05/2019    PLAN: Dnya has no evidence of breast or ovarian cancer at present.  We are continuing intensified screening.  She will have her axillary ultrasound in mid April for what almost certainly are going to be reactive lymph node secondary to her COVID-19 vaccine.  She will then have repeat breast MRI in October.  We will pick up on a repeat mammogram in April 2022 and I will see her shortly after that.  She understands false positives are very common at the start of intensified screening but they do settle down subsequently.  I have advised her that the mammography and breast MRI should always be done around the time when she is menstruating, which is the simplest way to make sure the radiology tests are done at the same time in the menstrual cycle and also it is a  time when there is less estrogenic activity  Her migraines are poorly controlled.  I am not sure why her insurance is refusing to approve the medication that she previously found effective.  Sometimes they want the patient to "fail" medication.  I am starting her on sumatriptan and topiramate and also referring her to a neurologist.  If these drugs do not work for her that the neurologist can take it from there  She knows to call for any other issue that may develop before her next visit here.  Total encounter time 30 minutes.*   Lamoine Fredricksen, Virgie Dad, MD  10/05/19 3:05 PM Medical Oncology and Hematology [facility] [place], [place] [address] Tel. [phone]    Fax. [phone]    I, Wilburn Mylar, am acting as scribe for Dr. Virgie Dad. Rachel Stein.  I, Lurline Del MD, have reviewed the above documentation for accuracy and completeness, and I agree with the above.   *Total Encounter Time as defined by the Centers for Medicare and Medicaid Services includes, in addition to the face-to-face time of a patient visit (documented in the note above) non-face-to-face time: obtaining and reviewing outside history, ordering and reviewing medications, tests or procedures, care coordination (communications with other health care professionals or caregivers) and documentation in the medical record.

## 2019-10-05 ENCOUNTER — Encounter: Payer: Self-pay | Admitting: Neurology

## 2019-10-05 ENCOUNTER — Encounter: Payer: Self-pay | Admitting: Oncology

## 2019-10-05 ENCOUNTER — Other Ambulatory Visit: Payer: Self-pay

## 2019-10-05 ENCOUNTER — Inpatient Hospital Stay: Payer: 59 | Attending: Oncology | Admitting: Oncology

## 2019-10-05 VITALS — BP 118/68 | HR 79 | Temp 98.0°F | Resp 18 | Ht 60.0 in | Wt 145.7 lb

## 2019-10-05 DIAGNOSIS — Z8041 Family history of malignant neoplasm of ovary: Secondary | ICD-10-CM | POA: Diagnosis not present

## 2019-10-05 DIAGNOSIS — Z1501 Genetic susceptibility to malignant neoplasm of breast: Secondary | ICD-10-CM | POA: Diagnosis not present

## 2019-10-05 DIAGNOSIS — Z1509 Genetic susceptibility to other malignant neoplasm: Secondary | ICD-10-CM | POA: Diagnosis not present

## 2019-10-05 DIAGNOSIS — Z803 Family history of malignant neoplasm of breast: Secondary | ICD-10-CM | POA: Insufficient documentation

## 2019-10-05 DIAGNOSIS — G43009 Migraine without aura, not intractable, without status migrainosus: Secondary | ICD-10-CM

## 2019-10-05 DIAGNOSIS — R87810 Cervical high risk human papillomavirus (HPV) DNA test positive: Secondary | ICD-10-CM | POA: Insufficient documentation

## 2019-10-05 DIAGNOSIS — Z793 Long term (current) use of hormonal contraceptives: Secondary | ICD-10-CM | POA: Insufficient documentation

## 2019-10-05 DIAGNOSIS — Z9189 Other specified personal risk factors, not elsewhere classified: Secondary | ICD-10-CM

## 2019-10-05 DIAGNOSIS — G43909 Migraine, unspecified, not intractable, without status migrainosus: Secondary | ICD-10-CM | POA: Diagnosis not present

## 2019-10-05 DIAGNOSIS — J45909 Unspecified asthma, uncomplicated: Secondary | ICD-10-CM | POA: Diagnosis not present

## 2019-10-05 MED ORDER — SUMATRIPTAN SUCCINATE 50 MG PO TABS: 50.0000 mg | ORAL_TABLET | ORAL | 3 refills | Status: AC | PRN

## 2019-10-05 MED ORDER — TOPIRAMATE 50 MG PO TABS: 25.0000 mg | ORAL_TABLET | Freq: Every day | ORAL | 4 refills | Status: DC

## 2019-10-05 MED FILL — SUMAtriptan SUCCINATE 50 MG: 50 | 30 days supply | Qty: 9 | Fill #0

## 2019-10-05 MED FILL — TOPIRAMATE 50 MG TABLET: 50 | 60 days supply | Qty: 30 | Fill #0

## 2019-10-09 ENCOUNTER — Telehealth: Payer: Self-pay | Admitting: Oncology

## 2019-10-09 NOTE — Telephone Encounter (Signed)
Called pt to schedule fu per 3/18 los. Pt stated that she didn't want to schedule something that far out and that she would call the office back to schedule an appt.

## 2019-11-02 ENCOUNTER — Other Ambulatory Visit: Payer: Self-pay

## 2019-11-02 ENCOUNTER — Ambulatory Visit
Admission: RE | Admit: 2019-11-02 | Discharge: 2019-11-02 | Disposition: A | Discharge status: Home / Self Care | Payer: 59 | Source: Ambulatory Visit | Attending: Oncology | Admitting: Oncology

## 2019-11-02 DIAGNOSIS — R590 Localized enlarged lymph nodes: Secondary | ICD-10-CM

## 2019-12-04 NOTE — Progress Notes (Signed)
NEUROLOGY CONSULTATION NOTE  Rachel Stein MRN: [id number] DOB: 05/04/1996  Referring provider: Lurline Del, MD Primary care provider: Flossie Buffy, NP  Reason for consult:  migraine  HISTORY OF PRESENT ILLNESS: Rachel Stein is a 24 year old right-handed female with positive BRCA1 who presents for migraine.  History supplemented by referring provider's note.  She started having headaches at age 59.  They are severe right temporal/frontal pounding/throbbing pain.  They typically last 2 hours and occur about 2 days a week.  Once every other month, they may occur back to back for 5 days (not associated with her menstrual cycle).  They are associated with nausea, photophobia, phonophobia, osmophobia but no vomiting, visual disturbance, numbness or weakness. Triggers include emotional stress.  They are relieved by sleep.  She has daily headaches as well.  A dull pr diffuse pressure like pain.  Current NSAIDS:  none Current analgesics:  Excedrin Migraine (once a week) Current triptans:  Sumatriptan 34m (makes her drowsy but effective) Current ergotamine:  none Current anti-emetic:  none Current muscle relaxants:  none Current anti-anxiolytic:  none Current sleep aide:  none Current Antihypertensive medications:  none Current Antidepressant medications:  none Current Anticonvulsant medications:  topiramate 243mat bedtime (side effects - paresthesias, decreased appetite, sluggish) Current anti-CGRP:  none Current Vitamins/Herbal/Supplements:  none Current Antihistamines/Decongestants:  none Other therapy:  none Hormone/birth control:  Seasonale Other medications:  none  Past NSAIDS:  Ibuprofen, naproxen Past analgesics:  Tylenol; maybe Fioricet Past abortive triptans:  Maxalt, Relpax Past abortive ergotamine:  none Past muscle relaxants:  none Past anti-emetic:  none Past antihypertensive medications:  propranolol Past antidepressant medications:   Nortriptyline, amitriptyline Past anticonvulsant medications:  Trokendi XR 5066meffective, insurance not covered) Past anti-CGRP:  none Past vitamins/Herbal/Supplements:  none Past antihistamines/decongestants:  none Other past therapies:  none  Caffeine:  1 to 2 cups coffee daily Diet:  32 oz water daily.  Skips meals at work.   Exercise:  Not routine Depression:  no; Anxiety:  Yes.  Work-related Other pain:  no Sleep:  okay She is a nurse Family history of headache:  Sister (migraines), father (migraines)  CBC and CMP from 04/03/2019 were unremarkable.   PAST MEDICAL HISTORY: Past Medical History:  Diagnosis Date  . Asthma   . BRCA1 positive 2019  . Family history of BRCA gene mutation    patient's mother - BRCA1 positive  . Family history of breast cancer   . Family history of ovarian cancer   . Migraine    no apparent aura    PAST SURGICAL HISTORY: Past Surgical History:  Procedure Laterality Date  . ADENOIDECTOMY    . BREAST BIOPSY Right 02/2019  . BREAST SURGERY  02/2019   Rt.Breast Bx--tubular adenoma  . KNEE SURGERY Right 2012  . TONSILLECTOMY      MEDICATIONS: Current Outpatient Medications on File Prior to Visit  Medication Sig Dispense Refill  . Aspirin-Acetaminophen-Caffeine (MIGRAINE RELIEF PO) Take by mouth as needed.    . lMarland Kitchenvonorgestrel-ethinyl estradiol (SEASONALE) 0.15-0.03 MG tablet Take 1 tablet by mouth daily. 1 Package 3  . SUMAtriptan (IMITREX) 50 MG tablet Take 1 tablet (50 mg total) by mouth every 2 (two) hours as needed for migraine. May repeat in 2 hours if headache persists or recurs. 10 tablet 3  . topiramate (TOPAMAX) 50 MG tablet Take 0.5 tablets (25 mg total) by mouth daily. 30 tablet 4   No current facility-administered medications on file prior to visit.  ALLERGIES: Allergies  Allergen Reactions  . Amoxicillin Rash and Swelling  . Cefaclor Rash and Swelling  . Sulfa Antibiotics Rash and Swelling    FAMILY  HISTORY: Family History  Problem Relation Age of Onset  . Ovarian cancer Mother 40       BRCA positive  . BRCA 1/2 Mother 71       BRCA1  . Breast cancer Maternal Grandmother        d. 55  . BRCA 1/2 Sister 27       BRCA1 pos  . BRCA 1/2 Sister 50       BRCA1 pos  . Uterine cancer Other        MGMs sister   SOCIAL HISTORY: Social History   Socioeconomic History  . Marital status: Single    Spouse name: Not on file  . Number of children: Not on file  . Years of education: Not on file  . Highest education level: Not on file  Occupational History  . Not on file  Tobacco Use  . Smoking status: Never Smoker  . Smokeless tobacco: Never Used  Substance and Sexual Activity  . Alcohol use: Yes    Alcohol/week: 1.0 standard drinks    Types: 1 Glasses of wine per week    Comment: social  . Drug use: Not Currently  . Sexual activity: Not Currently    Birth control/protection: Injection  Other Topics Concern  . Not on file  Social History Narrative  . Not on file   Social Determinants of Health   Financial Resource Strain:   . Difficulty of Paying Living Expenses:   Food Insecurity:   . Worried About Charity fundraiser in the Last Year:   . Arboriculturist in the Last Year:   Transportation Needs:   . Film/video editor (Medical):   Marland Kitchen Lack of Transportation (Non-Medical):   Physical Activity:   . Days of Exercise per Week:   . Minutes of Exercise per Session:   Stress:   . Feeling of Stress :   Social Connections:   . Frequency of Communication with Friends and Family:   . Frequency of Social Gatherings with Friends and Family:   . Attends Religious Services:   . Active Member of Clubs or Organizations:   . Attends Archivist Meetings:   Marland Kitchen Marital Status:   Intimate Partner Violence:   . Fear of Current or Ex-Partner:   . Emotionally Abused:   Marland Kitchen Physically Abused:   . Sexually Abused:     PHYSICAL EXAM: Blood pressure 113/76, pulse 92, height  5' 1"  (1.549 m), weight 141 lb 3.2 oz (64 kg), SpO2 97 %. General: No acute distress.  Patient appears well-groomed.   Head:  Normocephalic/atraumatic Eyes:  fundi examined but not visualized Neck: supple, no paraspinal tenderness, full range of motion Back: No paraspinal tenderness Heart: regular rate and rhythm Lungs: Clear to auscultation bilaterally. Vascular: No carotid bruits. Neurological Exam: Mental status: alert and oriented to person, place, and time, recent and remote memory intact, fund of knowledge intact, attention and concentration intact, speech fluent and not dysarthric, language intact. Cranial nerves: CN I: not tested CN II: pupils equal, round and reactive to light, visual fields intact CN III, IV, VI:  full range of motion, no nystagmus, no ptosis CN V: facial sensation intact CN VII: upper and lower face symmetric CN VIII: hearing intact CN IX, X: gag intact, uvula midline CN XI: sternocleidomastoid and  trapezius muscles intact CN XII: tongue midline Bulk & Tone: normal, no fasciculations. Motor:  5/5 throughout  Sensation:  Pinprick and vibration sensation intact. Deep Tendon Reflexes:  2+ throughout, toes downgoing.  Finger to nose testing:  Without dysmetria.  Heel to shin:  Without dysmetria.  Gait:  Normal station and stride.  Able to turn and tandem walk. Romberg negative.  IMPRESSION: Chronic migraine without aura, without status migrainosus, not intractable  PLAN: 1.  For preventative management, start Trokendi XR 63m daily.  She has responded well to this medication without side effects.  Stop topiramate IR. 2.  For abortive therapy, she will try Ubrelvy 103mat work since it tends to cause less drowsiness.  She still has sumatriptan if needed.  Zofran ODT 66m28mor nausea. 3.  Limit use of pain relievers to no more than 2 days out of week to prevent risk of rebound or medication-overuse headache. 4.  Keep headache diary 5.  Exercise, hydration,  caffeine cessation, sleep hygiene, monitor for and avoid triggers 6. Follow up 4 months.   Thank you for allowing me to take part in the care of this patient.  Rachel Stein  CC:  ChaDebbora Stein  GusLurline Stein

## 2019-12-05 ENCOUNTER — Other Ambulatory Visit: Payer: Self-pay

## 2019-12-05 ENCOUNTER — Encounter: Payer: Self-pay | Admitting: Neurology

## 2019-12-05 ENCOUNTER — Telehealth: Payer: Self-pay | Admitting: Nurse Practitioner

## 2019-12-05 ENCOUNTER — Ambulatory Visit: Payer: 59 | Admitting: Neurology

## 2019-12-05 VITALS — BP 113/76 | HR 92 | Ht 61.0 in | Wt 141.2 lb

## 2019-12-05 DIAGNOSIS — G43709 Chronic migraine without aura, not intractable, without status migrainosus: Secondary | ICD-10-CM

## 2019-12-05 MED ORDER — ONDANSETRON 4 MG PO TBDP: 4.0000 mg | ORAL_TABLET | Freq: Three times a day (TID) | ORAL | 5 refills | Status: AC | PRN

## 2019-12-05 MED ORDER — TROKENDI XR 50 MG PO CP24: 50.0000 mg | ORAL_CAPSULE | Freq: Every day | ORAL | 5 refills | Status: AC

## 2019-12-05 MED FILL — ONDANSETRON ODT 4 MG TABLET: 4 | 7 days supply | Qty: 20 | Fill #0

## 2019-12-05 MED FILL — TROKENDI XR 50 MG CAPSULE: 50 | 30 days supply | Qty: 30 | Fill #0

## 2019-12-05 NOTE — Telephone Encounter (Signed)
Charlotte please advise.  Pt is asking for an order for a TB skin test for her new employment.  Last office visit-09/16/18

## 2019-12-05 NOTE — Telephone Encounter (Signed)
Ok to place PPD skin test. Remind about need for OV with me annually.

## 2019-12-05 NOTE — Telephone Encounter (Signed)
Patient is calling and wanted to see if an order be put in for patient to get a blood TB skin test for new employment. CB is [phone]

## 2019-12-05 NOTE — Patient Instructions (Addendum)
1.  Start Trokendi XR 50mg  at bedtime.  We can increase dose in 2 months if needed.  Stop regular topiramate. 2.  You have sumatriptan if needed.  You may also try Ubrelvy 100mg . Take 1 tablet.  May repeat in 2 hours if needed (maximum 2 tablets in 24 hours).  It is known to not cause drowsiness.  If effective, contact me and I will send a prescription. 3.  Limit use of pain relievers to no more than 2 days out of week to prevent risk of rebound or medication-overuse headache. 4.  Keep headache diary 5.  Follow up in 4 months   Migraine Headache A migraine headache is a very strong throbbing pain on one side or both sides of your head. This type of headache can also cause other symptoms. It can last from 4 hours to 3 days. Talk with your doctor about what things may bring on (trigger) this condition. What are the causes? The exact cause of this condition is not known. This condition may be triggered or caused by:  Drinking alcohol.  Smoking.  Taking medicines, such as: ? Medicine used to treat chest pain (nitroglycerin). ? Birth control pills. ? Estrogen. ? Some blood pressure medicines.  Eating or drinking certain products.  Doing physical activity. Other things that may trigger a migraine headache include:  Having a menstrual period.  Pregnancy.  Hunger.  Stress.  Not getting enough sleep or getting too much sleep.  Weather changes.  Tiredness (fatigue). What increases the risk?  Being 87-104 years old.  Being female.  Having a family history of migraine headaches.  Being Caucasian.  Having depression or anxiety.  Being very overweight. What are the signs or symptoms?  A throbbing pain. This pain may: ? Happen in any area of the head, such as on one side or both sides. ? Make it hard to do daily activities. ? Get worse with physical activity. ? Get worse around bright lights or loud noises.  Other symptoms may include: ? Feeling sick to your stomach  (nauseous). ? Vomiting. ? Dizziness. ? Being sensitive to bright lights, loud noises, or smells.  Before you get a migraine headache, you may get warning signs (an aura). An aura may include: ? Seeing flashing lights or having blind spots. ? Seeing bright spots, halos, or zigzag lines. ? Having tunnel vision or blurred vision. ? Having numbness or a tingling feeling. ? Having trouble talking. ? Having weak muscles.  Some people have symptoms after a migraine headache (postdromal phase), such as: ? Tiredness. ? Trouble thinking (concentrating). How is this treated?  Taking medicines that: ? Relieve pain. ? Relieve the feeling of being sick to your stomach. ? Prevent migraine headaches.  Treatment may also include: ? Having acupuncture. ? Avoiding foods that bring on migraine headaches. ? Learning ways to control your body functions (biofeedback). ? Therapy to help you know and deal with negative thoughts (cognitive behavioral therapy). Follow these instructions at home: Medicines  Take over-the-counter and prescription medicines only as told by your doctor.  Ask your doctor if the medicine prescribed to you: ? Requires you to avoid driving or using heavy machinery. ? Can cause trouble pooping (constipation). You may need to take these steps to prevent or treat trouble pooping:  Drink enough fluid to keep your pee (urine) pale yellow.  Take over-the-counter or prescription medicines.  Eat foods that are high in fiber. These include beans, whole grains, and fresh fruits and vegetables.  Limit foods that are high in fat and sugar. These include fried or sweet foods. Lifestyle  Do not drink alcohol.  Do not use any products that contain nicotine or tobacco, such as cigarettes, e-cigarettes, and chewing tobacco. If you need help quitting, ask your doctor.  Get at least 8 hours of sleep every night.  Limit and deal with stress. General instructions      Keep a  journal to find out what may bring on your migraine headaches. For example, write down: ? What you eat and drink. ? How much sleep you get. ? Any change in what you eat or drink. ? Any change in your medicines.  If you have a migraine headache: ? Avoid things that make your symptoms worse, such as bright lights. ? It may help to lie down in a dark, quiet room. ? Do not drive or use heavy machinery. ? Ask your doctor what activities are safe for you.  Keep all follow-up visits as told by your doctor. This is important. Contact a doctor if:  You get a migraine headache that is different or worse than others you have had.  You have more than 15 headache days in one month. Get help right away if:  Your migraine headache gets very bad.  Your migraine headache lasts longer than 72 hours.  You have a fever.  You have a stiff neck.  You have trouble seeing.  Your muscles feel weak or like you cannot control them.  You start to lose your balance a lot.  You start to have trouble walking.  You pass out (faint).  You have a seizure. Summary  A migraine headache is a very strong throbbing pain on one side or both sides of your head. These headaches can also cause other symptoms.  This condition may be treated with medicines and changes to your lifestyle.  Keep a journal to find out what may bring on your migraine headaches.  Contact a doctor if you get a migraine headache that is different or worse than others you have had.  Contact your doctor if you have more than 15 headache days in a month. This information is not intended to replace advice given to you by your health care provider. Make sure you discuss any questions you have with your health care provider. Document Revised: 10/28/2018 Document Reviewed: 08/18/2018 Elsevier Patient Education  [place].

## 2019-12-06 ENCOUNTER — Other Ambulatory Visit: Payer: Self-pay

## 2019-12-06 NOTE — Telephone Encounter (Signed)
Left a voicemail for pt to call office and schedule with nurse visit for TB skin test and to make Physical exam.

## 2019-12-07 ENCOUNTER — Other Ambulatory Visit (INDEPENDENT_AMBULATORY_CARE_PROVIDER_SITE_OTHER): Payer: 59

## 2019-12-07 DIAGNOSIS — Z111 Encounter for screening for respiratory tuberculosis: Secondary | ICD-10-CM | POA: Diagnosis not present

## 2019-12-10 LAB — QUANTIFERON-TB GOLD PLUS
Mitogen-NIL: 8.56 IU/mL
NIL: 0.02 IU/mL
QuantiFERON-TB Gold Plus: NEGATIVE
TB1-NIL: 0 IU/mL
TB2-NIL: 0 IU/mL

## 2019-12-12 MED FILL — SETLAKIN 0.15 MG-0.03 MG TA: 0.15-0.03 | 91 days supply | Qty: 91 | Fill #2

## 2020-03-27 NOTE — Progress Notes (Deleted)
24 y.o. G0P0000 Single Caucasian female here for annual exam.    PCP:     No LMP recorded. (Menstrual status: Oral contraceptives).           Sexually active: {yes no:314532}  The current method of family planning is*** oral progesterone-only contraceptive--Seasonale.    Exercising: {yes no:314532}  {types:19826} Smoker:  no  Health Maintenance: Pap: 04-03-19 Neg,  04-01-18 Neg:Pos HR HPV History of abnormal Pap:  Yes,  04-01-18 Neg:Pos HR HPV;05-04-18 colposcopy neg MMG: 07-31-19 Hx BRCA 1 gene mutation: 3D/Lt.Br.poss.mass and poss.enlarged Lt.axillary lymph node;Rt.Br.neg/density C.  Lt.Br.Diag.w/US revealed at least 2 enlarged left axillary lymph nodes. Given recent vaccination in Left arm, these nodes are likely reactive. Rec.3 month follow up/BiRads3 Colonoscopy:  n/a BMD:   n/a  Result  n/a TDaP:  2017 Gardasil:   yes HIV:04-03-19 NR Hep C:04-03-19 Neg Screening Labs:  Hb today: ***, Urine today: ***   reports that she has never smoked. She has never used smokeless tobacco. She reports current alcohol use of about 1.0 standard drink of alcohol per week. She reports previous drug use.  Past Medical History:  Diagnosis Date  . Asthma   . BRCA1 positive 2019  . Family history of BRCA gene mutation    patient's mother - BRCA1 positive  . Family history of breast cancer   . Family history of ovarian cancer   . Migraine    no apparent aura    Past Surgical History:  Procedure Laterality Date  . ADENOIDECTOMY    . BREAST BIOPSY Right 02/2019  . BREAST SURGERY  02/2019   Rt.Breast Bx--tubular adenoma  . KNEE SURGERY Right 2012  . TONSILLECTOMY      Current Outpatient Medications  Medication Sig Dispense Refill  . Aspirin-Acetaminophen-Caffeine (MIGRAINE RELIEF PO) Take by mouth as needed.    Marland Kitchen levonorgestrel-ethinyl estradiol (SEASONALE) 0.15-0.03 MG tablet Take 1 tablet by mouth daily. 1 Package 3  . ondansetron (ZOFRAN ODT) 4 MG disintegrating tablet Take 1 tablet (4 mg  total) by mouth every 8 (eight) hours as needed for nausea or vomiting. 20 tablet 5  . SUMAtriptan (IMITREX) 50 MG tablet Take 1 tablet (50 mg total) by mouth every 2 (two) hours as needed for migraine. May repeat in 2 hours if headache persists or recurs. 10 tablet 3  . Topiramate ER (TROKENDI XR) 50 MG CP24 Take 50 mg by mouth at bedtime. 30 capsule 5   No current facility-administered medications for this visit.    Family History  Problem Relation Age of Onset  . Ovarian cancer Mother 21       BRCA positive  . BRCA 1/2 Mother 75       BRCA1  . Breast cancer Maternal Grandmother        d. 32  . BRCA 1/2 Sister 73       BRCA1 pos  . BRCA 1/2 Sister 8       BRCA1 pos  . Uterine cancer Other        MGMs sister    Review of Systems  Exam:   There were no vitals taken for this visit.    General appearance: alert, cooperative and appears stated age Head: normocephalic, without obvious abnormality, atraumatic Neck: no adenopathy, supple, symmetrical, trachea midline and thyroid normal to inspection and palpation Lungs: clear to auscultation bilaterally Breasts: normal appearance, no masses or tenderness, No nipple retraction or dimpling, No nipple discharge or bleeding, No axillary adenopathy Heart: regular rate and rhythm  Abdomen: soft, non-tender; no masses, no organomegaly Extremities: extremities normal, atraumatic, no cyanosis or edema Skin: skin color, texture, turgor normal. No rashes or lesions Lymph nodes: cervical, supraclavicular, and axillary nodes normal. Neurologic: grossly normal  Pelvic: External genitalia:  no lesions              No abnormal inguinal nodes palpated.              Urethra:  normal appearing urethra with no masses, tenderness or lesions              Bartholins and Skenes: normal                 Vagina: normal appearing vagina with normal color and discharge, no lesions              Cervix: no lesions              Pap taken: {yes  no:314532} Bimanual Exam:  Uterus:  normal size, contour, position, consistency, mobility, non-tender              Adnexa: no mass, fullness, tenderness              Rectal exam: {yes no:314532}.  Confirms.              Anus:  normal sphincter tone, no lesions  Chaperone was present for exam.  Assessment:   Well woman visit with normal exam.   Plan: Mammogram screening discussed. Self breast awareness reviewed. Pap and HR HPV as above. Guidelines for Calcium, Vitamin D, regular exercise program including cardiovascular and weight bearing exercise.   Follow up annually and prn.   Additional counseling given.  {yes [id number]. _______ minutes face to face time of which over 50% was spent in counseling.    After visit summary provided.

## 2020-04-03 ENCOUNTER — Ambulatory Visit: Payer: 59 | Admitting: Obstetrics and Gynecology

## 2020-04-15 NOTE — Progress Notes (Deleted)
NEUROLOGY FOLLOW UP OFFICE NOTE  Rachel Stein [id number]  HISTORY OF PRESENT ILLNESS: Rachel Stein is a 24 year old right-handed female with positive BRCA1 who follows up for migraines.  UPDATE: Due to side effects, topiramate IR was switched to ER. Intensity:  *** Duration:  *** Frequency:  *** Frequency of abortive medication: ***  Current NSAIDS:  none Current analgesics:  Excedrin Migraine (once a week) Current triptans:  Sumatriptan 58m (makes her drowsy but effective) Current ergotamine:  none Current anti-emetic:  Zofran ODT 455mCurrent muscle relaxants:  none Current anti-anxiolytic:  none Current sleep aide:  none Current Antihypertensive medications:  none Current Antidepressant medications:  none Current Anticonvulsant medications:  Trokendi XR 5040murrent anti-CGRP:  Ubrelvy 100m8mrrent Vitamins/Herbal/Supplements:  none Current Antihistamines/Decongestants:  none Other therapy:  none Hormone/birth control:  Seasonale Other medications:  none   Caffeine:  1 to 2 cups coffee daily Diet:  32 oz water daily.  Skips meals at work.   Exercise:  Not routine Depression:  no; Anxiety:  Yes.  Work-related Other pain:  no Sleep:  okay She is a nurse  HISTORY: She started having headaches at age 1. 11hey are severe right temporal/frontal pounding/throbbing pain.  They typically last 2 hours and occur about 2 days a week.  Once every other month, they may occur back to back for 5 days (not associated with her menstrual cycle).  They are associated with nausea, photophobia, phonophobia, osmophobia but no vomiting, visual disturbance, numbness or weakness. Triggers include emotional stress.  They are relieved by sleep.  She has daily headaches as well.  A dull pr diffuse pressure like pain.  Past NSAIDS:  Ibuprofen, naproxen Past analgesics:  Tylenol; maybe Fioricet Past abortive triptans:  Maxalt, Relpax Past abortive ergotamine:   none Past muscle relaxants:  none Past anti-emetic:  none Past antihypertensive medications:  propranolol Past antidepressant medications:  Nortriptyline, amitriptyline Past anticonvulsant medications:  Trokendi XR 50mg41mfective, insurance not covered) Past anti-CGRP:  none Past vitamins/Herbal/Supplements:  none Past antihistamines/decongestants:  none Other past therapies:  none   Family history of headache:  Sister (migraines), father (migraines)  PAST MEDICAL HISTORY: Past Medical History:  Diagnosis Date  . Asthma   . BRCA1 positive 2019  . Family history of BRCA gene mutation    patient's mother - BRCA1 positive  . Family history of breast cancer   . Family history of ovarian cancer   . Migraine    no apparent aura    MEDICATIONS: Current Outpatient Medications on File Prior to Visit  Medication Sig Dispense Refill  . Aspirin-Acetaminophen-Caffeine (MIGRAINE RELIEF PO) Take by mouth as needed.    . levMarland Kitchennorgestrel-ethinyl estradiol (SEASONALE) 0.15-0.03 MG tablet Take 1 tablet by mouth daily. 1 Package 3  . ondansetron (ZOFRAN ODT) 4 MG disintegrating tablet Take 1 tablet (4 mg total) by mouth every 8 (eight) hours as needed for nausea or vomiting. 20 tablet 5  . SUMAtriptan (IMITREX) 50 MG tablet Take 1 tablet (50 mg total) by mouth every 2 (two) hours as needed for migraine. May repeat in 2 hours if headache persists or recurs. 10 tablet 3  . Topiramate ER (TROKENDI XR) 50 MG CP24 Take 50 mg by mouth at bedtime. 30 capsule 5   No current facility-administered medications on file prior to visit.    ALLERGIES: Allergies  Allergen Reactions  . Amoxicillin Rash and Swelling  . Cefaclor Rash and Swelling  . Sulfa Antibiotics Rash and Swelling  FAMILY HISTORY: Family History  Problem Relation Age of Onset  . Ovarian cancer Mother 35       BRCA positive  . BRCA 1/2 Mother 40       BRCA1  . Breast cancer Maternal Grandmother        d. 63  . BRCA 1/2 Sister  60       BRCA1 pos  . BRCA 1/2 Sister 26       BRCA1 pos  . Uterine cancer Other        MGMs sister   SOCIAL HISTORY: Social History   Socioeconomic History  . Marital status: Single    Spouse name: Not on file  . Number of children: Not on file  . Years of education: Not on file  . Highest education level: Not on file  Occupational History  . Not on file  Tobacco Use  . Smoking status: Never Smoker  . Smokeless tobacco: Never Used  Vaping Use  . Vaping Use: Never used  Substance and Sexual Activity  . Alcohol use: Yes    Alcohol/week: 1.0 standard drink    Types: 1 Glasses of wine per week    Comment: social  . Drug use: Not Currently  . Sexual activity: Not Currently    Birth control/protection: Injection  Other Topics Concern  . Not on file  Social History Narrative   Right handed   Lives alone 2nd fl apartment   Social Determinants of Health   Financial Resource Strain:   . Difficulty of Paying Living Expenses: Not on file  Food Insecurity:   . Worried About Charity fundraiser in the Last Year: Not on file  . Ran Out of Food in the Last Year: Not on file  Transportation Needs:   . Lack of Transportation (Medical): Not on file  . Lack of Transportation (Non-Medical): Not on file  Physical Activity:   . Days of Exercise per Week: Not on file  . Minutes of Exercise per Session: Not on file  Stress:   . Feeling of Stress : Not on file  Social Connections:   . Frequency of Communication with Friends and Family: Not on file  . Frequency of Social Gatherings with Friends and Family: Not on file  . Attends Religious Services: Not on file  . Active Member of Clubs or Organizations: Not on file  . Attends Archivist Meetings: Not on file  . Marital Status: Not on file  Intimate Partner Violence:   . Fear of Current or Ex-Partner: Not on file  . Emotionally Abused: Not on file  . Physically Abused: Not on file  . Sexually Abused: Not on file     PHYSICAL EXAM: *** General: No acute distress.  Patient appears well-groomed.   Head:  Normocephalic/atraumatic Eyes:  Fundi examined but not visualized Neck: supple, no paraspinal tenderness, full range of motion Heart:  Regular rate and rhythm Lungs:  Clear to auscultation bilaterally Back: No paraspinal tenderness Neurological Exam: alert and oriented to person, place, and time. Attention span and concentration intact, recent and remote memory intact, fund of knowledge intact.  Speech fluent and not dysarthric, language intact.  CN II-XII intact. Bulk and tone normal, muscle strength 5/5 throughout.  Sensation to light touch, temperature and vibration intact.  Deep tendon reflexes 2+ throughout, toes downgoing.  Finger to nose and heel to shin testing intact.  Gait normal, Romberg negative.  IMPRESSION: ***  PLAN: 1.  For preventative management, ***  2.  For abortive therapy, *** 3.  Limit use of pain relievers to no more than 2 days out of week to prevent risk of rebound or medication-overuse headache. 4.  Keep headache diary 5.  Exercise, hydration, caffeine cessation, sleep hygiene, monitor for and avoid triggers 6.  Follow up ***   Metta Clines, DO  CC:  Flossie Buffy, NP

## 2020-04-16 ENCOUNTER — Ambulatory Visit: Payer: 59 | Admitting: Neurology

## 2021-06-06 IMAGING — MR MR BILATERAL BREAST WITHOUT AND WITH CONTRAST
8 of 12 series · 32 of 48 positions shown · IV contrast (6 ml  gadavist)
Comparison: Previous exam(s).

CLINICAL DATA: 22-year-old female with BRCA 1 gene mutation. Family
history of breast cancer including grandmother who died at age 39.
Family history of ovarian cancer (mother).

LABS:  Not applicable
EXAM:
BILATERAL BREAST MRI WITH AND WITHOUT CONTRAST
TECHNIQUE: Multiplanar, multisequence MR images of both breasts were obtained
prior to and following the intravenous administration of 6 ml of
Gadavist

[Series 2: t2_tirm_tra ipat (a-p) · axial · 3.0mm · 0.70mm/px · 1 of 55 slices shown]
[im 1/55]
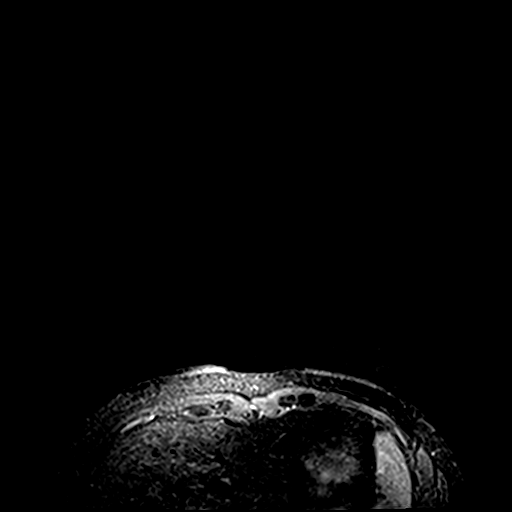

[Series 3: fl3d pre-cm no · axial · non-contrast · 1.2mm · 0.94mm/px · z∈[-71,+100]mm · 5 of 144 slices shown]
[im 1/144]
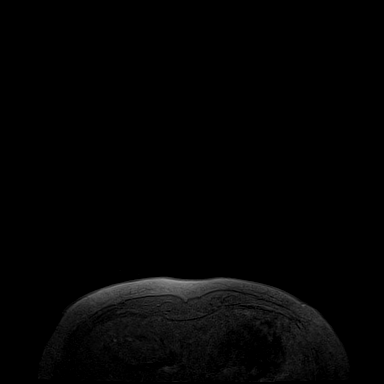
[im 36/144]
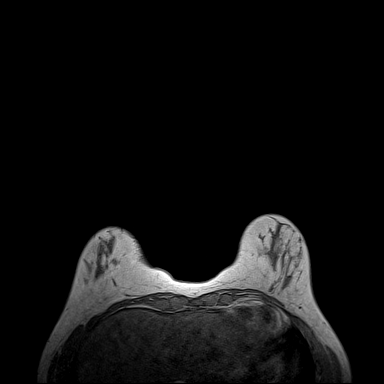
[im 72/144]
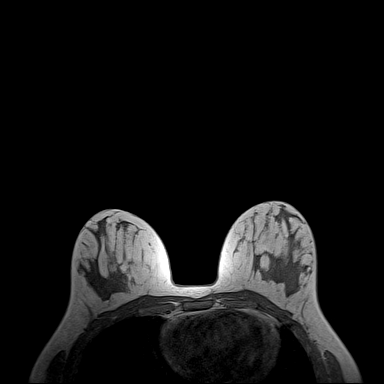
[im 108/144]
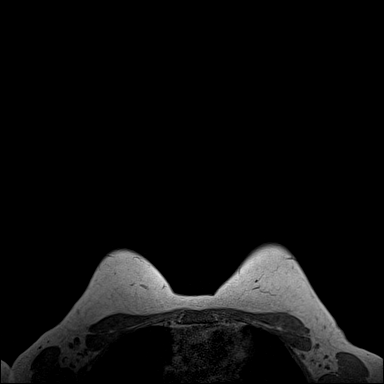
[im 144/144]
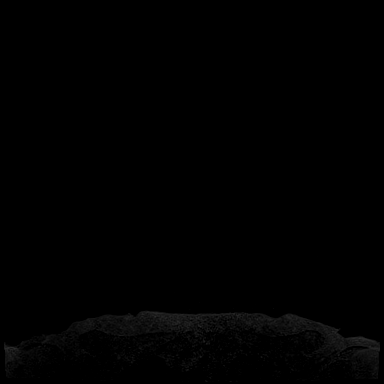

[Series 4: fl3d pre-cm · axial · non-contrast · 1.2mm · 0.94mm/px · z∈[-71,+100]mm · 5 of 144 slices shown]
[im 1/144]
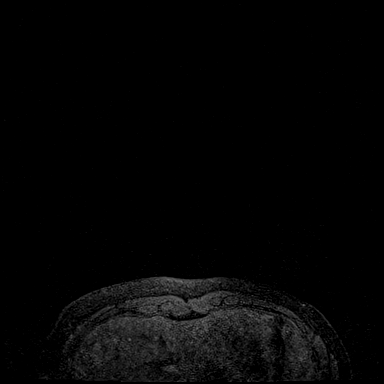
[im 36/144]
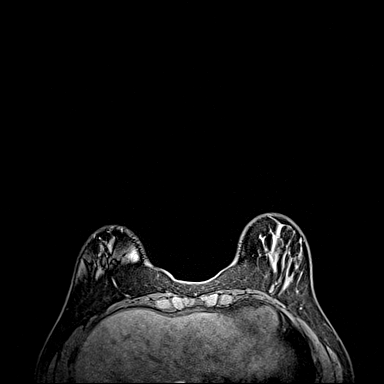
[im 72/144]
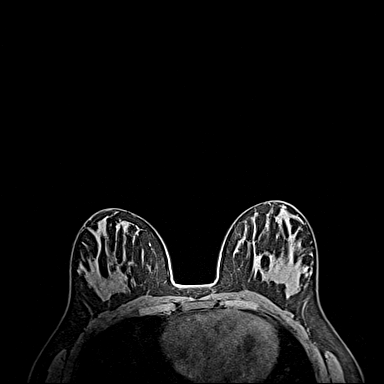
[im 108/144]
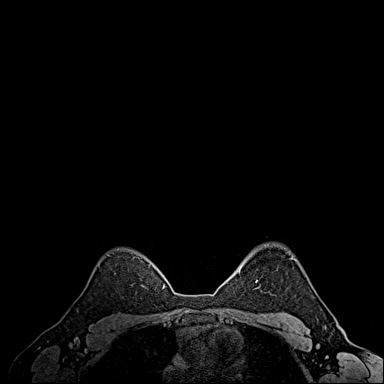
[im 144/144]
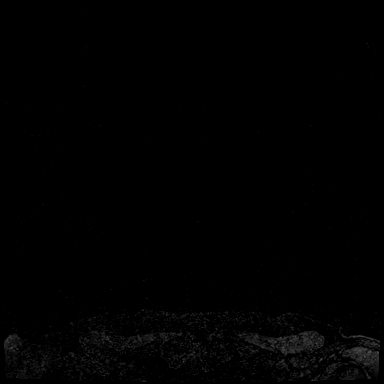

[Series 5: fl3d post immediate · axial · 1.2mm · 0.94mm/px · z∈[-71,+100]mm · 5 of 144 slices shown (1 of 3)]
[im 1/144]
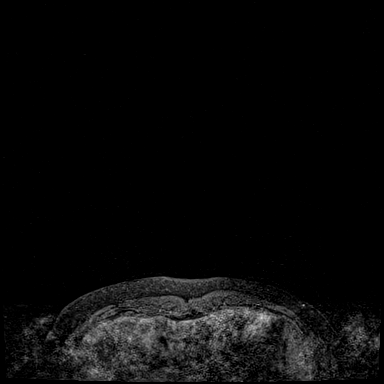
[im 36/144]
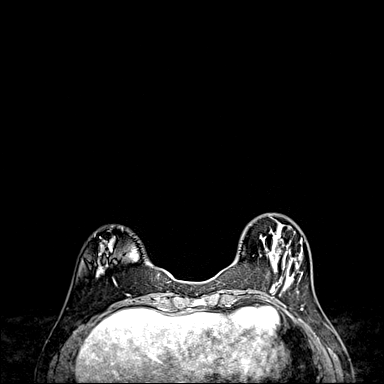
[im 72/144]
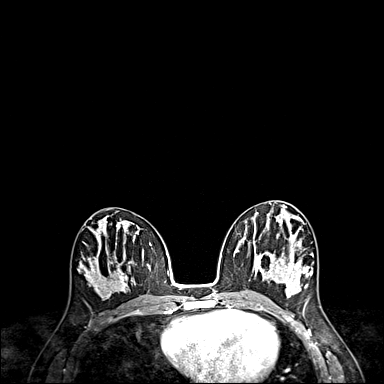
[im 108/144]
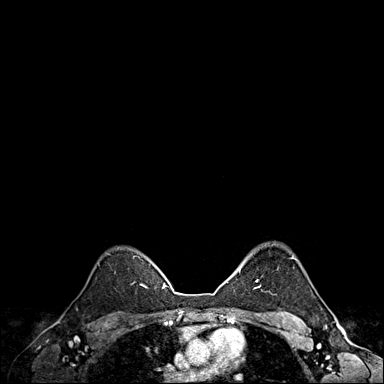
[im 144/144]
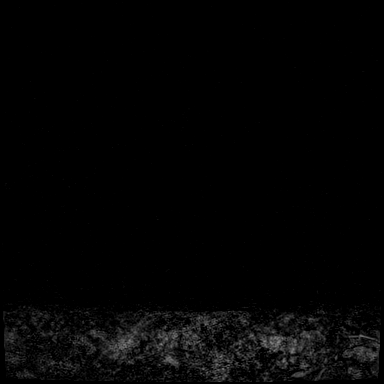

[Series 6: fl3d post immediate · axial · 1.2mm · 0.94mm/px · z∈[-71,+100]mm · 5 of 144 slices shown (2 of 3)]
[im 1/144]
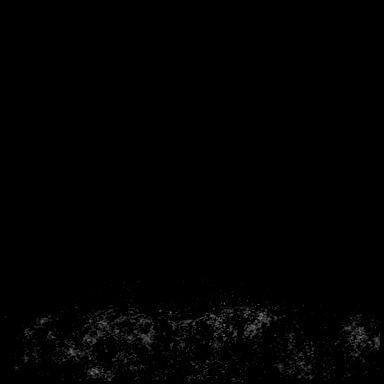
[im 36/144]
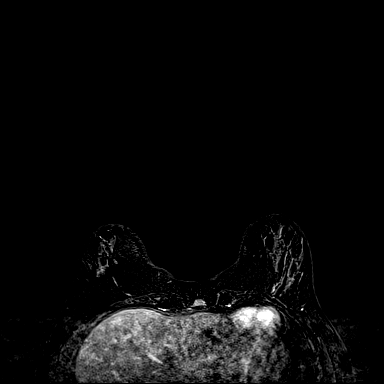
[im 72/144]
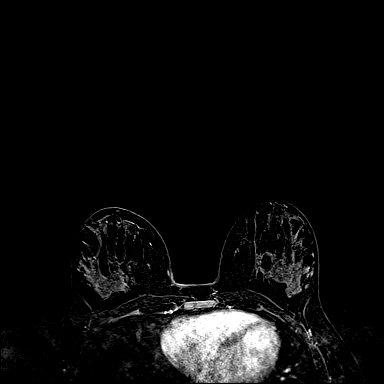
[im 108/144]
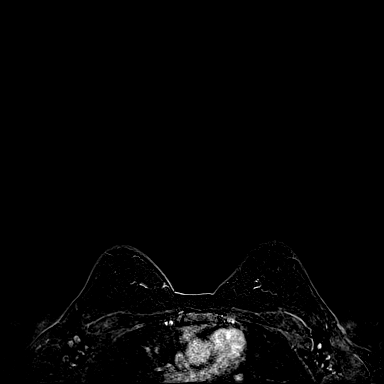
[im 144/144]
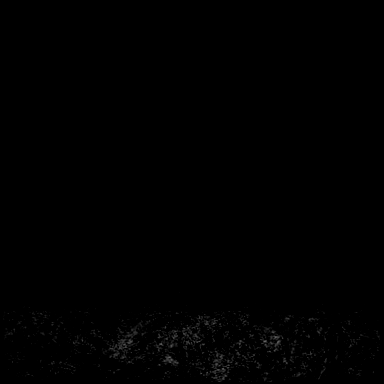

[Series 7: fl3d post immediate · axial · 172.8mm · 0.94mm/px · 1 of 1 slices shown (3 of 3)]
[im 1/1]
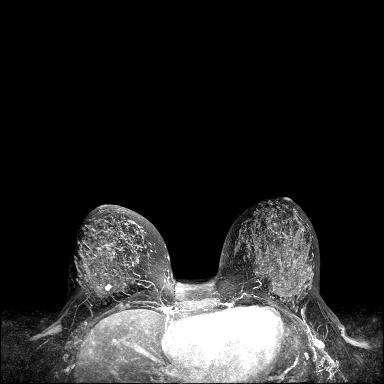

[Series 8: fl3d post 3min · axial · 1.2mm · 0.94mm/px · z∈[-71,+100]mm · 6 of 144 slices shown]
[im 1/144]
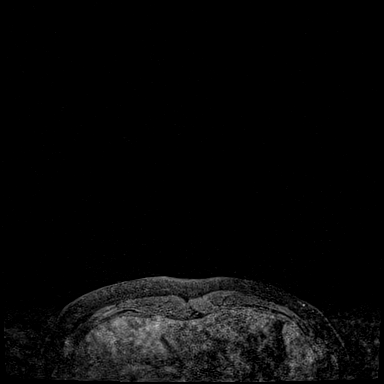
[im 29/144]
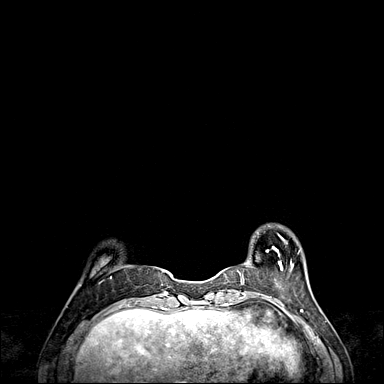
[im 58/144]
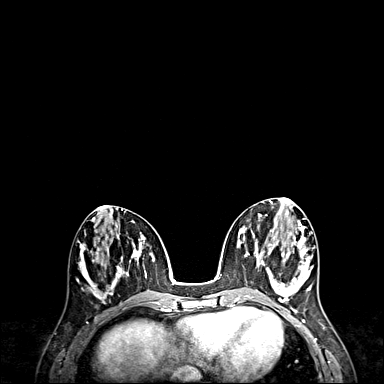
[im 86/144]
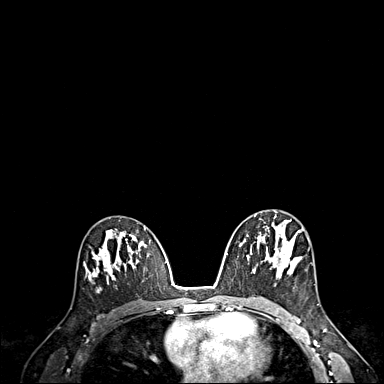
[im 115/144]
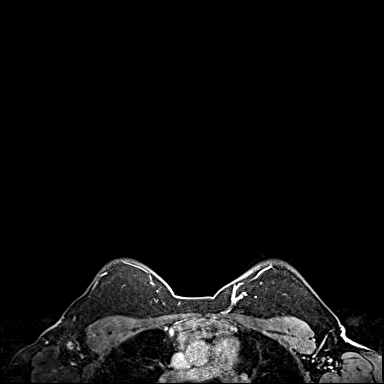
[im 144/144]
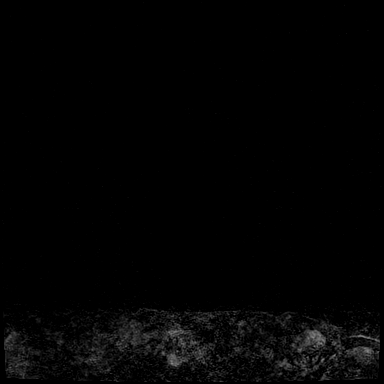

[Series 9: fl3d post 3min_sub · axial · 1.2mm · 0.94mm/px · z∈[-71,+31]mm · 4 of 144 slices shown]
[im 1/144]
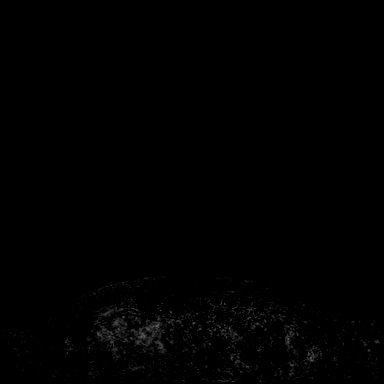
[im 29/144]
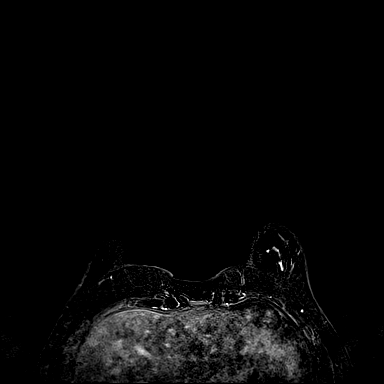
[im 58/144]
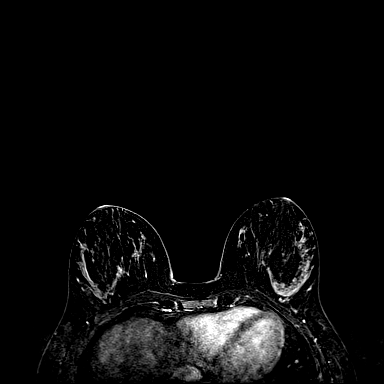
[im 86/144]
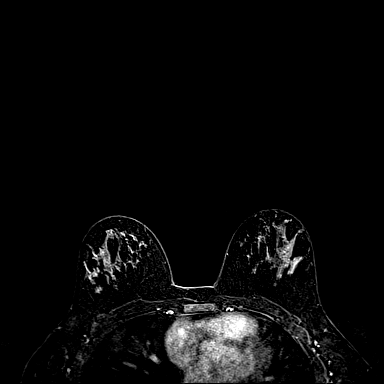

[32 of 48 positions shown; findings below may reference images not displayed]

Three-dimensional MR images were rendered by post-processing of the
original MR data on an independent workstation. The
three-dimensional MR images were interpreted, and findings are
reported in the following complete MRI report for this study. Three
dimensional images were evaluated at the independent [employer]
workstation
FINDINGS: Breast composition: c. Heterogeneous fibroglandular tissue.

Background parenchymal enhancement: Moderate.

Right breast: There is an irregular enhancing mass within the
slightly outer RIGHT breast, at posterior depth, 8-9 o'clock axis
region, measuring 6 mm greatest dimension, with plateau kinetics
(series 6, image 83).

There are no additional enhancing masses, suspicious non-mass
enhancement or secondary signs of malignancy within the RIGHT
breast.

Left breast: There are no suspicious enhancing masses, non-mass
enhancement or secondary signs of malignancy within the LEFT breast.

Lymph nodes: No abnormal appearing lymph nodes.

Ancillary findings:  None.
IMPRESSION: 1. Irregular enhancing mass within the slightly outer RIGHT breast,
at posterior depth, 8-9 o'clock axis region, measuring 6 mm, with
plateau enhancement kinetics. This is a suspicious finding for which
MRI-guided biopsy is recommended.
2. No evidence of malignancy within the LEFT breast.

RECOMMENDATION:
MRI-guided biopsy of the enhancing mass within the slightly outer
RIGHT breast, at posterior depth.

BI-RADS CATEGORY  4: Suspicious.
